# Patient Record
Sex: Female | Born: 1935 | Race: White | Hispanic: No | Marital: Married | Smoking: Never smoker
Health system: Southern US, Community
[De-identification: ages and names within clinical notes are randomized; demographics above are authoritative.]

## PROBLEM LIST (undated history)

## (undated) DIAGNOSIS — I10 Essential (primary) hypertension: Secondary | ICD-10-CM

## (undated) DIAGNOSIS — I509 Heart failure, unspecified: Secondary | ICD-10-CM

## (undated) DIAGNOSIS — D649 Anemia, unspecified: Secondary | ICD-10-CM

## (undated) DIAGNOSIS — M1990 Unspecified osteoarthritis, unspecified site: Secondary | ICD-10-CM

## (undated) DIAGNOSIS — N183 Chronic kidney disease, stage 3 unspecified: Secondary | ICD-10-CM

## (undated) DIAGNOSIS — L0390 Cellulitis, unspecified: Secondary | ICD-10-CM

## (undated) DIAGNOSIS — I071 Rheumatic tricuspid insufficiency: Secondary | ICD-10-CM

## (undated) HISTORY — DX: Rheumatic tricuspid insufficiency: I07.1

## (undated) HISTORY — PX: ELBOW SURGERY: SHX618

## (undated) HISTORY — PX: KNEE SURGERY: SHX244

---

## 1998-04-21 ENCOUNTER — Other Ambulatory Visit: Admission: RE | Admit: 1998-04-21 | Discharge: 1998-04-21 | Payer: Self-pay | Admitting: Gastroenterology

## 1998-05-19 ENCOUNTER — Ambulatory Visit: Admission: RE | Admit: 1998-05-19 | Discharge: 1998-05-19 | Payer: Self-pay | Admitting: Internal Medicine

## 1999-05-16 ENCOUNTER — Other Ambulatory Visit: Admission: RE | Admit: 1999-05-16 | Discharge: 1999-05-16 | Payer: Self-pay | Admitting: Gastroenterology

## 1999-05-16 ENCOUNTER — Encounter (INDEPENDENT_AMBULATORY_CARE_PROVIDER_SITE_OTHER): Payer: Self-pay | Admitting: *Deleted

## 2000-01-03 ENCOUNTER — Encounter: Payer: Self-pay | Admitting: Internal Medicine

## 2000-01-03 ENCOUNTER — Encounter: Admission: RE | Admit: 2000-01-03 | Discharge: 2000-01-03 | Payer: Self-pay | Admitting: Internal Medicine

## 2000-03-20 ENCOUNTER — Other Ambulatory Visit: Admission: RE | Admit: 2000-03-20 | Discharge: 2000-03-20 | Payer: Self-pay | Admitting: Internal Medicine

## 2000-03-24 ENCOUNTER — Inpatient Hospital Stay (HOSPITAL_COMMUNITY): Admission: EM | Admit: 2000-03-24 | Discharge: 2000-03-28 | Payer: Self-pay | Admitting: Emergency Medicine

## 2000-03-24 ENCOUNTER — Encounter: Payer: Self-pay | Admitting: Orthopedic Surgery

## 2000-03-24 ENCOUNTER — Encounter: Payer: Self-pay | Admitting: Emergency Medicine

## 2000-03-25 ENCOUNTER — Encounter: Payer: Self-pay | Admitting: Orthopedic Surgery

## 2000-03-26 ENCOUNTER — Encounter: Payer: Self-pay | Admitting: Orthopedic Surgery

## 2000-03-28 ENCOUNTER — Inpatient Hospital Stay (HOSPITAL_COMMUNITY)
Admission: RE | Admit: 2000-03-28 | Discharge: 2000-04-05 | Payer: Self-pay | Admitting: Physical Medicine & Rehabilitation

## 2000-04-03 ENCOUNTER — Encounter: Payer: Self-pay | Admitting: Orthopedic Surgery

## 2001-04-09 ENCOUNTER — Encounter: Admission: RE | Admit: 2001-04-09 | Discharge: 2001-04-09 | Payer: Self-pay | Admitting: Internal Medicine

## 2001-04-09 ENCOUNTER — Encounter: Payer: Self-pay | Admitting: Internal Medicine

## 2002-05-06 ENCOUNTER — Encounter: Admission: RE | Admit: 2002-05-06 | Discharge: 2002-05-06 | Payer: Self-pay | Admitting: Internal Medicine

## 2002-05-06 ENCOUNTER — Encounter: Payer: Self-pay | Admitting: Internal Medicine

## 2002-10-31 ENCOUNTER — Encounter: Payer: Self-pay | Admitting: Gastroenterology

## 2003-03-16 ENCOUNTER — Other Ambulatory Visit: Admission: RE | Admit: 2003-03-16 | Discharge: 2003-03-16 | Payer: Self-pay | Admitting: Internal Medicine

## 2003-06-15 ENCOUNTER — Encounter: Admission: RE | Admit: 2003-06-15 | Discharge: 2003-06-15 | Payer: Self-pay | Admitting: Internal Medicine

## 2004-07-29 ENCOUNTER — Encounter: Admission: RE | Admit: 2004-07-29 | Discharge: 2004-07-29 | Payer: Self-pay | Admitting: Internal Medicine

## 2004-08-26 ENCOUNTER — Ambulatory Visit: Payer: Self-pay | Admitting: Internal Medicine

## 2005-02-23 ENCOUNTER — Ambulatory Visit: Payer: Self-pay | Admitting: Internal Medicine

## 2005-06-13 ENCOUNTER — Ambulatory Visit: Payer: Self-pay | Admitting: Internal Medicine

## 2005-09-11 ENCOUNTER — Encounter: Admission: RE | Admit: 2005-09-11 | Discharge: 2005-09-11 | Payer: Self-pay | Admitting: Internal Medicine

## 2006-01-03 ENCOUNTER — Ambulatory Visit: Payer: Self-pay | Admitting: Internal Medicine

## 2006-05-15 ENCOUNTER — Ambulatory Visit: Payer: Self-pay | Admitting: Internal Medicine

## 2006-09-03 ENCOUNTER — Ambulatory Visit: Payer: Self-pay | Admitting: Internal Medicine

## 2006-11-02 ENCOUNTER — Encounter: Admission: RE | Admit: 2006-11-02 | Discharge: 2006-11-02 | Payer: Self-pay | Admitting: Internal Medicine

## 2006-12-25 ENCOUNTER — Ambulatory Visit: Payer: Self-pay | Admitting: Internal Medicine

## 2006-12-27 ENCOUNTER — Encounter: Payer: Self-pay | Admitting: Internal Medicine

## 2006-12-27 DIAGNOSIS — H409 Unspecified glaucoma: Secondary | ICD-10-CM | POA: Insufficient documentation

## 2006-12-27 DIAGNOSIS — I10 Essential (primary) hypertension: Secondary | ICD-10-CM | POA: Insufficient documentation

## 2006-12-27 DIAGNOSIS — K2270 Barrett's esophagus without dysplasia: Secondary | ICD-10-CM | POA: Insufficient documentation

## 2006-12-27 DIAGNOSIS — M1990 Unspecified osteoarthritis, unspecified site: Secondary | ICD-10-CM | POA: Insufficient documentation

## 2006-12-27 DIAGNOSIS — M810 Age-related osteoporosis without current pathological fracture: Secondary | ICD-10-CM | POA: Insufficient documentation

## 2007-06-18 ENCOUNTER — Ambulatory Visit: Payer: Self-pay | Admitting: Internal Medicine

## 2007-09-10 ENCOUNTER — Telehealth (INDEPENDENT_AMBULATORY_CARE_PROVIDER_SITE_OTHER): Payer: Self-pay | Admitting: *Deleted

## 2007-09-10 ENCOUNTER — Ambulatory Visit: Payer: Self-pay

## 2007-09-10 ENCOUNTER — Ambulatory Visit: Payer: Self-pay | Admitting: Internal Medicine

## 2007-09-10 DIAGNOSIS — R609 Edema, unspecified: Secondary | ICD-10-CM

## 2007-09-11 ENCOUNTER — Telehealth: Payer: Self-pay | Admitting: Internal Medicine

## 2007-12-11 ENCOUNTER — Encounter: Admission: RE | Admit: 2007-12-11 | Discharge: 2007-12-11 | Payer: Self-pay | Admitting: Internal Medicine

## 2008-06-02 ENCOUNTER — Ambulatory Visit: Payer: Self-pay | Admitting: Internal Medicine

## 2008-06-04 ENCOUNTER — Emergency Department (HOSPITAL_COMMUNITY): Admission: EM | Admit: 2008-06-04 | Discharge: 2008-06-04 | Payer: Self-pay | Admitting: Emergency Medicine

## 2008-06-05 ENCOUNTER — Ambulatory Visit: Payer: Self-pay | Admitting: Internal Medicine

## 2008-06-05 DIAGNOSIS — D638 Anemia in other chronic diseases classified elsewhere: Secondary | ICD-10-CM | POA: Insufficient documentation

## 2008-06-05 LAB — CONVERTED CEMR LAB: Ferritin: 18.3 ng/mL (ref 10.0–291.0)

## 2008-06-17 ENCOUNTER — Ambulatory Visit: Payer: Self-pay | Admitting: Family Medicine

## 2008-06-17 DIAGNOSIS — K625 Hemorrhage of anus and rectum: Secondary | ICD-10-CM

## 2008-06-17 DIAGNOSIS — R197 Diarrhea, unspecified: Secondary | ICD-10-CM

## 2008-06-22 ENCOUNTER — Ambulatory Visit: Payer: Self-pay | Admitting: Internal Medicine

## 2008-06-23 ENCOUNTER — Telehealth (INDEPENDENT_AMBULATORY_CARE_PROVIDER_SITE_OTHER): Payer: Self-pay | Admitting: *Deleted

## 2008-06-23 LAB — CONVERTED CEMR LAB
ALT: 17 units/L (ref 0–35)
Albumin: 3.4 g/dL — ABNORMAL LOW (ref 3.5–5.2)
Basophils Absolute: 0 10*3/uL (ref 0.0–0.1)
Eosinophils Absolute: 0 10*3/uL (ref 0.0–0.7)
Eosinophils Relative: 0.6 % (ref 0.0–5.0)
GFR calc non Af Amer: 33 mL/min
Hemoglobin: 9.1 g/dL — ABNORMAL LOW (ref 12.0–15.0)
MCHC: 33.4 g/dL (ref 30.0–36.0)
Monocytes Relative: 12.8 % — ABNORMAL HIGH (ref 3.0–12.0)
Potassium: 4.6 meq/L (ref 3.5–5.1)
RBC: 3.04 M/uL — ABNORMAL LOW (ref 3.87–5.11)
Total Bilirubin: 0.4 mg/dL (ref 0.3–1.2)
WBC: 6.6 10*3/uL (ref 4.5–10.5)

## 2008-07-15 DIAGNOSIS — K449 Diaphragmatic hernia without obstruction or gangrene: Secondary | ICD-10-CM | POA: Insufficient documentation

## 2008-07-15 DIAGNOSIS — K219 Gastro-esophageal reflux disease without esophagitis: Secondary | ICD-10-CM

## 2008-07-15 DIAGNOSIS — Z860100 Personal history of colon polyps, unspecified: Secondary | ICD-10-CM | POA: Insufficient documentation

## 2008-07-15 DIAGNOSIS — K222 Esophageal obstruction: Secondary | ICD-10-CM

## 2008-07-15 DIAGNOSIS — K5730 Diverticulosis of large intestine without perforation or abscess without bleeding: Secondary | ICD-10-CM | POA: Insufficient documentation

## 2008-07-16 ENCOUNTER — Ambulatory Visit: Payer: Self-pay | Admitting: Gastroenterology

## 2008-07-16 DIAGNOSIS — D509 Iron deficiency anemia, unspecified: Secondary | ICD-10-CM

## 2008-07-20 ENCOUNTER — Ambulatory Visit: Payer: Self-pay | Admitting: Gastroenterology

## 2008-08-05 ENCOUNTER — Ambulatory Visit: Payer: Self-pay | Admitting: Gastroenterology

## 2008-08-12 ENCOUNTER — Ambulatory Visit: Payer: Self-pay | Admitting: Gastroenterology

## 2008-09-10 ENCOUNTER — Telehealth: Payer: Self-pay | Admitting: Gastroenterology

## 2008-11-10 ENCOUNTER — Telehealth: Payer: Self-pay | Admitting: Internal Medicine

## 2009-09-16 ENCOUNTER — Ambulatory Visit: Payer: Self-pay | Admitting: Internal Medicine

## 2009-11-30 ENCOUNTER — Ambulatory Visit: Payer: Self-pay | Admitting: Internal Medicine

## 2009-11-30 ENCOUNTER — Telehealth: Payer: Self-pay | Admitting: Internal Medicine

## 2009-11-30 LAB — CONVERTED CEMR LAB
Basophils Relative: 0.2 % (ref 0.0–3.0)
CO2: 26 meq/L (ref 19–32)
Eosinophils Relative: 0 % (ref 0.0–5.0)
GFR calc non Af Amer: 28.37 mL/min (ref >60)
Lymphocytes Relative: 8.4 % — ABNORMAL LOW (ref 12.0–46.0)
Monocytes Absolute: 0.6 10*3/uL (ref 0.1–1.0)
Neutrophils Relative %: 82.6 % — ABNORMAL HIGH (ref 43.0–77.0)
Platelets: 240 10*3/uL (ref 150.0–400.0)
Potassium: 5.3 meq/L — ABNORMAL HIGH (ref 3.5–5.1)
RBC: 3.45 M/uL — ABNORMAL LOW (ref 3.87–5.11)
Sodium: 142 meq/L (ref 135–145)
WBC: 7.2 10*3/uL (ref 4.5–10.5)

## 2009-12-08 ENCOUNTER — Telehealth: Payer: Self-pay

## 2009-12-16 ENCOUNTER — Telehealth: Payer: Self-pay | Admitting: Internal Medicine

## 2009-12-20 ENCOUNTER — Telehealth: Payer: Self-pay | Admitting: Internal Medicine

## 2010-04-01 ENCOUNTER — Ambulatory Visit: Payer: Self-pay | Admitting: Internal Medicine

## 2010-04-01 LAB — CONVERTED CEMR LAB
BUN: 18 mg/dL (ref 6–23)
Calcium: 9.6 mg/dL (ref 8.4–10.5)
Creatinine, Ser: 1.3 mg/dL — ABNORMAL HIGH (ref 0.4–1.2)
GFR calc non Af Amer: 40.2 mL/min (ref >60)
Glucose, Bld: 96 mg/dL (ref 70–99)
HCT: 30.6 % — ABNORMAL LOW (ref 36.0–46.0)
Lymphocytes Relative: 15.6 % (ref 12.0–46.0)
Monocytes Absolute: 0.7 10*3/uL (ref 0.1–1.0)
Neutro Abs: 3.5 10*3/uL (ref 1.4–7.7)
Platelets: 222 10*3/uL (ref 150.0–400.0)
Potassium: 4.1 meq/L (ref 3.5–5.1)
Pro B Natriuretic peptide (BNP): 705.9 pg/mL — ABNORMAL HIGH (ref 0.0–100.0)
Sodium: 137 meq/L (ref 135–145)

## 2010-04-15 ENCOUNTER — Ambulatory Visit: Payer: Self-pay | Admitting: Internal Medicine

## 2010-04-15 DIAGNOSIS — I5020 Unspecified systolic (congestive) heart failure: Secondary | ICD-10-CM | POA: Insufficient documentation

## 2010-04-15 LAB — CONVERTED CEMR LAB
BUN: 31 mg/dL — ABNORMAL HIGH (ref 6–23)
Glucose, Bld: 98 mg/dL (ref 70–99)

## 2010-04-29 ENCOUNTER — Telehealth: Payer: Self-pay

## 2010-05-02 ENCOUNTER — Ambulatory Visit: Payer: Self-pay | Admitting: Internal Medicine

## 2010-08-09 ENCOUNTER — Ambulatory Visit
Admission: RE | Admit: 2010-08-09 | Discharge: 2010-08-09 | Payer: Self-pay | Source: Home / Self Care | Attending: Internal Medicine | Admitting: Internal Medicine

## 2010-08-09 ENCOUNTER — Other Ambulatory Visit: Payer: Self-pay | Admitting: Internal Medicine

## 2010-08-09 LAB — BASIC METABOLIC PANEL
BUN: 43 mg/dL — ABNORMAL HIGH (ref 6–23)
CO2: 24 mEq/L (ref 19–32)
Calcium: 9.2 mg/dL (ref 8.4–10.5)
Chloride: 107 mEq/L (ref 96–112)
Creatinine, Ser: 2.2 mg/dL — ABNORMAL HIGH (ref 0.4–1.2)
GFR: 22.71 mL/min — ABNORMAL LOW (ref >60.00)
Glucose, Bld: 125 mg/dL — ABNORMAL HIGH (ref 70–99)
Potassium: 4.5 mEq/L (ref 3.5–5.1)
Sodium: 139 mEq/L (ref 135–145)

## 2010-08-09 LAB — BRAIN NATRIURETIC PEPTIDE: Pro B Natriuretic peptide (BNP): 1414.6 pg/mL — ABNORMAL HIGH (ref 0.0–100.0)

## 2010-08-18 ENCOUNTER — Telehealth: Payer: Self-pay | Admitting: Internal Medicine

## 2010-09-04 LAB — CONVERTED CEMR LAB
ALT: 14 units/L (ref 0–35)
AST: 22 units/L (ref 0–37)
BUN: 20 mg/dL (ref 6–23)
Basophils Absolute: 0 10*3/uL (ref 0.0–0.1)
Basophils Relative: 0.2 % (ref 0.0–3.0)
CO2: 28 meq/L (ref 19–32)
HCT: 33.4 % — ABNORMAL LOW (ref 36.0–46.0)
Hemoglobin: 11 g/dL — ABNORMAL LOW (ref 12.0–15.0)
Lymphs Abs: 1.3 10*3/uL (ref 0.7–4.0)
MCV: 94.6 fL (ref 78.0–100.0)
Neutro Abs: 2.9 10*3/uL (ref 1.4–7.7)
Neutrophils Relative %: 60.8 % (ref 43.0–77.0)
Platelets: 237 10*3/uL (ref 150.0–400.0)
RBC: 3.53 M/uL — ABNORMAL LOW (ref 3.87–5.11)
RDW: 12.7 % (ref 11.5–14.6)
Sodium: 142 meq/L (ref 135–145)
TSH: 1.39 microintl units/mL (ref 0.35–5.50)
Total Bilirubin: 0.4 mg/dL (ref 0.3–1.2)
WBC: 4.8 10*3/uL (ref 4.5–10.5)

## 2010-09-08 NOTE — Progress Notes (Signed)
Summary: refill omeprazole  Phone Note Refill Request Message from:  Fax from Pharmacy on August 18, 2010 11:53 AM  Refills Requested: Medication #1:  PRILOSEC 20 MG CPDR Take 1 capsule by mouth every morning [facility]    Method Requested: Fax to Local Pharmacy Initial call taken by: Duard Brady LPN,  August 18, 2010 11:53 AM    Prescriptions: PRILOSEC 20 MG CPDR (OMEPRAZOLE) Take 1 capsule by mouth every morning  #90 x 6   Entered by:   Duard Brady LPN   Authorized by:   Gordy Savers  MD   Signed by:   Duard Brady LPN on 16/05/9603   Method used:   Faxed to ...       [facility] (mail-order)             , [place]         Ph: [phone]       Fax: [phone]   RxID:   [id number]

## 2010-09-08 NOTE — Assessment & Plan Note (Signed)
Summary: fu on med/njr/pt rescd//ccm   Vital Signs:  Patient profile:   77 year old female Weight:      128 pounds Temp:     98.2 degrees F oral BP sitting:   118 / 72  (left arm) Cuff size:   regular  Vitals Entered By: Duard Brady LPN (May 02, 2010 11:31 AM) CC: rov - f/u on meds - doing well Is Patient Diabetic? No   Primary Care Provider:  Marzella Schlein  CC:  rov - f/u on meds - doing well.  History of Present Illness: 76 year old patient who is seen today for follow-up of her congestive heart failure.  She is asymptomatic.  Her peripheral edema has greatly improved and her weight is down over the past few weeks.  Denies any shortness of breath, orthopnea, or PND.  Presently, she remains on furosemide 40 mg b.i.d.  Allergies: 1)  ! Morphine Sulfate Cr (Morphine Sulfate) 2)  ! Codeine Phosphate (Codeine Phosphate)  Past History:  Past Medical History: Reviewed history from 04/15/2010 and no changes required.  Hypertension Osteoporosis DJD Barrett's esophagus Glaucoma chronic kidney disease history of anemia Current Problems:  HIATAL HERNIA (ICD-553.3) ESOPHAGEAL STRICTURE (ICD-530.3) DIVERTICULOSIS, COLON (ICD-562.10) COLONIC POLYPS, ADENOMATOUS, HX OF (ICD-V12.72) GERD (ICD-530.81) RECTAL BLEEDING (ICD-569.3) DIARRHEA (ICD-787.91) ANEMIA OF CHRONIC DISEASE (ICD-285.29) LEG EDEMA, BILATERAL (ICD-782.3) GLAUCOMA NOS (ICD-365.9) DEGENERATIVE JOINT DISEASE (ICD-715.90) BARRETT'S ESOPHAGUS (ICD-530.85) OSTEOPOROSIS (ICD-733.00) HYPERTENSION (ICD-401.9) systolic heart failure  Past Surgical History: Reviewed history from 09/16/2009 and no changes required. Appendectomy Cataract extraction Total knee replacement Oophorectomy colonoscopy 2004, 2009 EGD 08-2008  Family History: both parents died of an apparent MI.  Father, age 36 mother age 61 4 brothers.  History prostate cancer .  He is one status post CABG  Family history also  positive for cerebral vascular disease and colon cancer No FH of Colon Cancer:  Review of Systems       The patient complains of peripheral edema.  The patient denies anorexia, fever, weight loss, weight gain, vision loss, decreased hearing, hoarseness, chest pain, syncope, dyspnea on exertion, prolonged cough, headaches, hemoptysis, abdominal pain, melena, hematochezia, severe indigestion/heartburn, hematuria, incontinence, genital sores, muscle weakness, suspicious skin lesions, transient blindness, difficulty walking, depression, unusual weight change, abnormal bleeding, enlarged lymph nodes, angioedema, and breast masses.    Physical Exam  General:  Well-developed,well-nourished,in no acute distress; alert,appropriate and cooperative throughout examination Head:  Normocephalic and atraumatic without obvious abnormalities. No apparent alopecia or balding. Eyes:  No corneal or conjunctival inflammation noted. EOMI. Perrla. Funduscopic exam benign, without hemorrhages, exudates or papilledema. Vision grossly normal. Mouth:  Oral mucosa and oropharynx without lesions or exudates.  Teeth in good repair. Neck:  No deformities, masses, or tenderness noted. Lungs:  Normal respiratory effort, chest expands symmetrically. Lungs are clear to auscultation, no crackles or wheezes.  O2 saturation 98 Heart:  Normal rate and regular rhythm. S1 and S2 normal without gallop, murmur, click, rub or other extra sounds.  pulse rate 55 to 60 Abdomen:  Bowel sounds positive,abdomen soft and non-tender without masses, organomegaly or hernias noted. Extremities:  1+ left pedal edema and 1+ right pedal edema.  1+ left pedal edema.     Impression & Recommendations:  Problem # 1:  CONGESTIVE HEART FAILURE, SYSTOLIC DYSFUNCTION (ICD-428.20)  The following medications were removed from the medication list:    Carvedilol 6.25 Mg Tabs (Carvedilol) ..... One half tablet twice daily Her updated medication list for  this problem includes:    Furosemide 40  Mg Tabs (Furosemide) ..... One every morning    Lisinopril 10 Mg Tabs (Lisinopril) ..... One daily    Carvedilol 3.125 Mg Tabs (Carvedilol) ..... One half tablet twice daily  The following medications were removed from the medication list:    Carvedilol 6.25 Mg Tabs (Carvedilol) ..... One half tablet twice daily Her updated medication list for this problem includes:    Furosemide 40 Mg Tabs (Furosemide) ..... One every morning    Lisinopril 10 Mg Tabs (Lisinopril) ..... One daily    Carvedilol 3.125 Mg Tabs (Carvedilol) ..... One half tablet twice daily  Problem # 2:  LEG EDEMA, BILATERAL (ICD-782.3)  Her updated medication list for this problem includes:    Furosemide 40 Mg Tabs (Furosemide) ..... One every morning  Her updated medication list for this problem includes:    Furosemide 40 Mg Tabs (Furosemide) ..... One every morning  Complete Medication List: 1)  Fosamax 70 Mg Tabs (Alendronate sodium) .... 1/2 q week 2)  Prilosec 20 Mg Cpdr (Omeprazole) .... Take 1 capsule by mouth every morning 3)  Calcium-vitamin D 600-200 Mg-unit Tabs (Calcium-vitamin d) .... 2 qd 4)  B Complex 100 Tabs (B complex vitamins) .... 2 qd 5)  Cvs Iron 325 (65 Fe) Mg Tabs (Ferrous sulfate) .Marland Kitchen.. 1 tablet by mouth every day 6)  Extra Strength Pain Reliever 500 Mg Tabs (Acetaminophen) .... Take three tablets by mouth once daily 7)  Furosemide 40 Mg Tabs (Furosemide) .... One every morning 8)  Lisinopril 10 Mg Tabs (Lisinopril) .... One daily 9)  Triple Flex 500-400-125 Mg Tabs (Glucosamine-chondroitin-msm) .... Qd 10)  Potassium Chloride Cr 10 Meq Cr-tabs (Potassium chloride) .... One  daily 11)  Carvedilol 3.125 Mg Tabs (Carvedilol) .... One half tablet twice daily  Patient Instructions: 1)  Limit your Sodium (Salt). 2)  WEIGH every Monday, Wednesday, and Friday 3)  call the office if there is any weight gain in excess of 5 pounds 4)  Please schedule a follow-up  appointment in 3 months. Prescriptions: CARVEDILOL 3.125 MG TABS (CARVEDILOL) one half tablet twice daily  #90 x 3   Entered and Authorized by:   Gordy Savers  MD   Signed by:   Gordy Savers  MD on 05/02/2010   Method used:   Electronically to        [facility] [phone]* (retail)       [address]       [place], [place]  [address]       Ph: [phone]       Fax: [phone]   RxID:   [id number]

## 2010-09-08 NOTE — Progress Notes (Signed)
Summary: pt rescd to 05-02-2010 due to transportation/weather issues  Phone Note Call from Patient   Caller: Patient---live call Initial call taken by: Warnell Forester,  April 29, 2010 9:14 AM

## 2010-09-08 NOTE — Assessment & Plan Note (Signed)
Summary: CPX (PT WILL COME IN FASTING) // RS   Vital Signs:  Patient profile:   77 year old [name] Height:      62 inches Weight:      126 pounds Temp:     98.3 degrees F BP sitting:   142 / 88  (right arm) Cuff size:   regular  Vitals Entered By: Duard Brady RN (September 16, 2009 9:55 AM)  Primary Care Provider:  Marzella Schlein   History of Present Illness: 77 year old patient who is seen today for a comprehensive evaluation.  Medical problems include a history of chronic kidney disease, and anemia of chronic disease.  She has osteoarthritis.  She has treated hypertension and a history of gastroesophageal reflux disease.  She has osteoporosis.  She has done quite well over the past several months.  She denies any cardiopulmonary complaints.  Her only complaint really are musculoskeletal.  over the past year or two, she  has had  upper and lower endoscopy.  She remains on chronic iron therapy  Allergies: 1)  ! Morphine Sulfate Cr (Morphine Sulfate) 2)  ! Codeine Phosphate (Codeine Phosphate)  Past History:  Past Medical History: Reviewed history from 07/16/2008 and no changes required.  Hypertension Osteoporosis DJD Barrett's esophagus Glaucoma chronic kidney disease history of anemia Current Problems:  HIATAL HERNIA (ICD-553.3) ESOPHAGEAL STRICTURE (ICD-530.3) DIVERTICULOSIS, COLON (ICD-562.10) COLONIC POLYPS, ADENOMATOUS, HX OF (ICD-V12.72) GERD (ICD-530.Valerie) RECTAL BLEEDING (ICD-569.3) DIARRHEA (ICD-787.91) ANEMIA OF CHRONIC DISEASE (ICD-285.29) LEG EDEMA, BILATERAL (ICD-782.3) GLAUCOMA NOS (ICD-365.9) DEGENERATIVE JOINT DISEASE (ICD-715.90) BARRETT'S ESOPHAGUS (ICD-530.85) OSTEOPOROSIS (ICD-733.00) HYPERTENSION (ICD-401.9)  Past Surgical History: Appendectomy Cataract extraction Total knee replacement Oophorectomy colonoscopy 2004, 2009 EGD 08-2008  Family History: Reviewed history from 07/16/2008 and no changes required. both parents died  of an apparent MI.  Father, age 12 mother age 1 4 brothers.  History prostate cancer recurs.  He is one status post CABG  Family history also positive for cerebral vascular disease and colon cancer No FH of Colon Cancer:  Social History: Reviewed history from 07/16/2008 and no changes required. Occupation: retired Patient has never smoked.  Alcohol Use - no Illicit Drug Use - no  Review of Systems       The patient complains of difficulty walking.  The patient denies anorexia, fever, weight loss, weight gain, vision loss, decreased hearing, hoarseness, chest pain, syncope, dyspnea on exertion, peripheral edema, prolonged cough, headaches, hemoptysis, abdominal pain, melena, hematochezia, severe indigestion/heartburn, hematuria, incontinence, genital sores, muscle weakness, suspicious skin lesions, transient blindness, depression, unusual weight change, abnormal bleeding, enlarged lymph nodes, angioedema, and breast masses.    Physical Exam  General:  elderly alert, no distress.  Blood pressure 120/80. Head:  Normocephalic and atraumatic without obvious abnormalities. No apparent alopecia or balding. Eyes:  No corneal or conjunctival inflammation noted. EOMI. Perrla. Funduscopic exam benign, without hemorrhages, exudates or papilledema. Vision grossly normal. right thyroidectomy scar Ears:  External ear exam shows no significant lesions or deformities.  Otoscopic examination reveals clear canals, tympanic membranes are intact bilaterally without bulging, retraction, inflammation or discharge. Hearing is grossly normal bilaterally. Nose:  External nasal examination shows no deformity or inflammation. Nasal mucosa are pink and moist without lesions or exudates. Mouth:  Oral mucosa and oropharynx without lesions or exudates.  upper dentures in place Neck:  No deformities, masses, or tenderness noted. Chest Wall:  No deformities, masses, or tenderness noted. Breasts:  No mass, nodules,  thickening, tenderness, bulging, retraction, inflamation, nipple discharge or skin changes noted.  Lungs:  Normal respiratory effort, chest expands symmetrically. Lungs are clear to auscultation, no crackles or wheezes. Heart:  Normal rate and regular rhythm. S1 and S2 normal without gallop, murmur, click, rub or other extra sounds. Abdomen:  Bowel sounds positive,abdomen soft and non-tender without masses, organomegaly or hernias noted. lower midline scar Rectal:  No external abnormalities noted. Normal sphincter tone. No rectal masses or tenderness. stool  hematest negative Msk:  osteo-arthritic changes and onychomycotic toenails surgical scar, left knee Pulses:  not easily palpable Extremities:  No clubbing, cyanosis, edema, or deformity noted with normal full range of motion of all joints.   Neurologic:  alert & oriented X3, cranial nerves II-XII intact, strength normal in all extremities, sensation intact to light touch, sensation intact to pinprick, and gait normal.  decreased left  patellar reflexalert & oriented X3, cranial nerves II-XII intact, strength normal in all extremities, sensation intact to light touch, sensation intact to pinprick, and gait normal.   Skin:  Intact without suspicious lesions or rashes Cervical Nodes:  No lymphadenopathy noted Axillary Nodes:  No palpable lymphadenopathy Inguinal Nodes:  No significant adenopathy Psych:  Cognition and judgment appear intact. Alert and cooperative with normal attention span and concentration. No apparent delusions, illusions, hallucinations   Impression & Recommendations:  Problem # 1:  ANEMIA-IRON DEFICIENCY (ICD-280.9)  Her updated medication list for this problem includes:    Cvs Iron 325 (65 Fe) Mg Tabs (Ferrous sulfate) .Marland Kitchen... 1 tablet by mouth every day  Her updated medication list for this problem includes:    Cvs Iron 325 (65 Fe) Mg Tabs (Ferrous sulfate) .Marland Kitchen... 1 tablet by mouth every day  Orders: Venipuncture  (81191) TLB-BMP (Basic Metabolic Panel-BMET) (80048-METABOL) TLB-CBC Platelet - w/Differential (85025-CBCD) TLB-Hepatic/Liver Function Pnl (80076-HEPATIC) TLB-TSH (Thyroid Stimulating Hormone) (84443-TSH)  Problem # 2:  GERD (ICD-530.Valerie)  Her updated medication list for this problem includes:    Prilosec 20 Mg Cpdr (Omeprazole) .Marland Kitchen... Take 1 capsule by mouth every morning  Her updated medication list for this problem includes:    Prilosec 20 Mg Cpdr (Omeprazole) .Marland Kitchen... Take 1 capsule by mouth every morning  Problem # 3:  DEGENERATIVE JOINT DISEASE (ICD-715.90)  Her updated medication list for this problem includes:    Extra Strength Pain Reliever 500 Mg Tabs (Acetaminophen) .Marland Kitchen... Take three tablets by mouth once daily  Her updated medication list for this problem includes:    Extra Strength Pain Reliever 500 Mg Tabs (Acetaminophen) .Marland Kitchen... Take three tablets by mouth once daily  Problem # 4:  HYPERTENSION (ICD-401.9)  Her updated medication list for this problem includes:    Doxazosin Mesylate 8 Mg Tabs (Doxazosin mesylate) .Marland Kitchen... 1/2 once daily    Amlodipine Besylate 5 Mg Tabs (Amlodipine besylate) ..... One daily  Orders: Venipuncture (47829) TLB-BMP (Basic Metabolic Panel-BMET) (80048-METABOL) TLB-CBC Platelet - w/Differential (85025-CBCD) TLB-Hepatic/Liver Function Pnl (80076-HEPATIC) TLB-TSH (Thyroid Stimulating Hormone) (84443-TSH)  Problem # 5:  OSTEOPOROSIS (ICD-733.00)  Her updated medication list for this problem includes:    Fosamax 70 Mg Tabs (Alendronate sodium) .Marland Kitchen... 1/2 q week  Her updated medication list for this problem includes:    Fosamax 70 Mg Tabs (Alendronate sodium) .Marland Kitchen... 1/2 q week  Complete Medication List: 1)  Doxazosin Mesylate 8 Mg Tabs (Doxazosin mesylate) .... 1/2 once daily 2)  Fosamax 70 Mg Tabs (Alendronate sodium) .... 1/2 q week 3)  Prilosec 20 Mg Cpdr (Omeprazole) .... Take 1 capsule by mouth every morning 4)  Calcium-vitamin D 600-200  Mg-unit Tabs (Calcium-vitamin d) .Marland KitchenMarland KitchenMarland Kitchen  2 qd 5)  B Complex 100 Tabs (B complex vitamins) .... 2 qd 6)  Amlodipine Besylate 5 Mg Tabs (Amlodipine besylate) .... One daily 7)  Glucosamine-msm 500-400 Mg Caps (Glucosamine sulfate-msm) .... Take 2 capsules by mouth once daily 8)  Cvs Iron 325 (65 Fe) Mg Tabs (Ferrous sulfate) .Marland Kitchen.. 1 tablet by mouth every day 9)  Extra Strength Pain Reliever 500 Mg Tabs (Acetaminophen) .... Take three tablets by mouth once daily  Other Orders: DRE (Z6109)  Patient Instructions: 1)  Please schedule a follow-up appointment in 6 months. 2)  Limit your Sodium (Salt). 3)  It is important that you exercise regularly at least 20 minutes 5 times a week. If you develop chest pain, have severe difficulty breathing, or feel very tired , stop exercising immediately and seek medical attention. Prescriptions: AMLODIPINE BESYLATE 5 MG TABS (AMLODIPINE BESYLATE) one daily  #90 Each x 6   Entered and Authorized by:   Gordy Savers  MD   Signed by:   Gordy Savers  MD on 09/16/2009   Method used:   Print then Give to Patient   RxID:   [id number] PRILOSEC 20 MG CPDR (OMEPRAZOLE) Take 1 capsule by mouth every morning  #90 x 6   Entered and Authorized by:   Gordy Savers  MD   Signed by:   Gordy Savers  MD on 09/16/2009   Method used:   Print then Give to Patient   RxID:   [id number] FOSAMAX 70 MG TABS (ALENDRONATE SODIUM) 1/2 q week  #12 Each x 6   Entered and Authorized by:   Gordy Savers  MD   Signed by:   Gordy Savers  MD on 09/16/2009   Method used:   Print then Give to Patient   RxID:   [id number] DOXAZOSIN MESYLATE 8 MG TABS (DOXAZOSIN MESYLATE) 1/2 once daily  #100 Each x 6   Entered and Authorized by:   Gordy Savers  MD   Signed by:   Gordy Savers  MD on 09/16/2009   Method used:   Print then Give to Patient   RxID:   [id number]   Appended Document: CPX (PT WILL COME IN FASTING) //  RS     Allergies: 1)  ! Morphine Sulfate Cr (Morphine Sulfate) 2)  ! Codeine Phosphate (Codeine Phosphate)   Complete Medication List: 1)  Doxazosin Mesylate 8 Mg Tabs (Doxazosin mesylate) .... 1/2 once daily 2)  Fosamax 70 Mg Tabs (Alendronate sodium) .... 1/2 q week 3)  Prilosec 20 Mg Cpdr (Omeprazole) .... Take 1 capsule by mouth every morning 4)  Calcium-vitamin D 600-200 Mg-unit Tabs (Calcium-vitamin d) .... 2 qd 5)  B Complex 100 Tabs (B complex vitamins) .... 2 qd 6)  Amlodipine Besylate 5 Mg Tabs (Amlodipine besylate) .... One daily 7)  Glucosamine-msm 500-400 Mg Caps (Glucosamine sulfate-msm) .... Take 2 capsules by mouth once daily 8)  Cvs Iron 325 (65 Fe) Mg Tabs (Ferrous sulfate) .Marland Kitchen.. 1 tablet by mouth every day 9)  Extra Strength Pain Reliever 500 Mg Tabs (Acetaminophen) .... Take three tablets by mouth once daily  Other Orders: UA Dipstick w/o Micro (manual) (63875) EKG w/ Interpretation (93000)   Laboratory Results   Urine Tests  Date/Time Received: September 16, 2009   Routine Urinalysis   Color: yellow Appearance: Clear Glucose: negative   (Normal Range: Negative) Bilirubin: negative   (Normal Range: Negative) Ketone: negative   (Normal Range: Negative) Spec. Gravity: 1.010   (  Normal Range: 1.003-1.035) Blood: negative   (Normal Range: Negative) pH: 6.0   (Normal Range: 5.0-8.0) Protein: negative   (Normal Range: Negative) Urobilinogen: 0.2   (Normal Range: 0-1) Nitrite: negative   (Normal Range: Negative) Leukocyte Esterace: negative   (Normal Range: Negative)    Comments: Duard Brady RN  September 16, 2009 11:24 AM

## 2010-09-08 NOTE — Progress Notes (Signed)
Summary: edema  Phone Note Call from Patient   Caller: Patient Call For: Gordy Savers  MD Summary of Call: Pt is complaining of edema legs and ankley.  She is holding Doxazosin as Dr. Kirtland Bouchard ordered.  Could this be the reason the swelling is so much worse? [phone] Initial call taken by: Lynann Beaver CMA,  Dec 16, 2009 4:30 PM  Follow-up for Phone Call        probably related to amlodipine-  decrease salt and make ROV next week if swelling worsens and will change BP med Follow-up by: Gordy Savers  MD,  Dec 16, 2009 4:58 PM  Additional Follow-up for Phone Call Additional follow up Details #1::        Line busy. Additional Follow-up by: Lynann Beaver CMA,  Dec 16, 2009 5:01 PM    Additional Follow-up for Phone Call Additional follow up Details #2::    Pt.  notified. Follow-up by: Lynann Beaver CMA,  Dec 17, 2009 8:09 AM

## 2010-09-08 NOTE — Assessment & Plan Note (Signed)
Summary: cough/dm   Vital Signs:  Patient profile:   77 year old female Weight:      143 pounds Temp:     97.8 degrees F oral BP sitting:   150 / 90  Vitals Entered By: Kathrynn Speed CMA (April 01, 2010 10:20 AM) CC: cough, x 3 weeks, swelling in both in groin area, sting in the vagina area, x 4 weeks,  finger are getting numb, when up near her face,src Is Patient Diabetic? No   Primary Care Provider:  Marzella Schlein  CC:  cough, x 3 weeks, swelling in both in groin area, sting in the vagina area, x 4 weeks, finger are getting numb, when up near her face, and src.  History of Present Illness: an 77 year old patient, who presents with a chief complaint of nonproductive cough for about 4 weeks.  Her chief complaint is worsening  lower extremity swelling.  This has progressed to the point that it involves the edema of the genital region.  She has treated hypertension on amlodipine.  She does have a history of prior lower extremity edema.  Denies any other symptoms of heart failure.  Does have a history of mild anemia and chronic kidney disease.  She has gastroesophageal reflux disease  Current Medications (verified): 1)  Doxazosin Mesylate 8 Mg Tabs (Doxazosin Mesylate) .... 1/2 Once Daily 2)  Fosamax 70 Mg Tabs (Alendronate Sodium) .... 1/2 Q Week 3)  Prilosec 20 Mg Cpdr (Omeprazole) .... Take 1 Capsule By Mouth Every Morning 4)  Calcium-Vitamin D 600-200 Mg-Unit  Tabs (Calcium-Vitamin D) .... 2 Qd 5)  B Complex 100   Tabs (B Complex Vitamins) .... 2 Qd 6)  Amlodipine Besylate 5 Mg Tabs (Amlodipine Besylate) .... One Daily 7)  Glucosamine-Msm 500-400 Mg Caps (Glucosamine Sulfate-Msm) .... Take 2 Capsules By Mouth Once Daily 8)  Cvs Iron 325 (65 Fe) Mg Tabs (Ferrous Sulfate) .Marland Kitchen.. 1 Tablet By Mouth Every Day 9)  Extra Strength Pain Reliever 500 Mg Tabs (Acetaminophen) .... Take Three Tablets By Mouth Once Daily  Allergies (verified): 1)  ! Morphine Sulfate Cr (Morphine  Sulfate) 2)  ! Codeine Phosphate (Codeine Phosphate)  Past History:  Past Medical History: Reviewed history from 07/16/2008 and no changes required.  Hypertension Osteoporosis DJD Barrett's esophagus Glaucoma chronic kidney disease history of anemia Current Problems:  HIATAL HERNIA (ICD-553.3) ESOPHAGEAL STRICTURE (ICD-530.3) DIVERTICULOSIS, COLON (ICD-562.10) COLONIC POLYPS, ADENOMATOUS, HX OF (ICD-V12.72) GERD (ICD-530.81) RECTAL BLEEDING (ICD-569.3) DIARRHEA (ICD-787.91) ANEMIA OF CHRONIC DISEASE (ICD-285.29) LEG EDEMA, BILATERAL (ICD-782.3) GLAUCOMA NOS (ICD-365.9) DEGENERATIVE JOINT DISEASE (ICD-715.90) BARRETT'S ESOPHAGUS (ICD-530.85) OSTEOPOROSIS (ICD-733.00) HYPERTENSION (ICD-401.9)  Past Surgical History: Reviewed history from 09/16/2009 and no changes required. Appendectomy Cataract extraction Total knee replacement Oophorectomy colonoscopy 2004, 2009 EGD 08-2008  Review of Systems       The patient complains of peripheral edema and prolonged cough.  The patient denies anorexia, fever, weight loss, weight gain, vision loss, decreased hearing, hoarseness, chest pain, syncope, dyspnea on exertion, headaches, hemoptysis, abdominal pain, melena, hematochezia, severe indigestion/heartburn, hematuria, incontinence, genital sores, muscle weakness, suspicious skin lesions, transient blindness, difficulty walking, depression, unusual weight change, abnormal bleeding, enlarged lymph nodes, angioedema, and breast masses.    Physical Exam  General:  elderly frail, no distress.  Blood pressure, normal Head:  Normocephalic and atraumatic without obvious abnormalities. No apparent alopecia or balding. Eyes:  No corneal or conjunctival inflammation noted. EOMI. Perrla. Funduscopic exam benign, without hemorrhages, exudates or papilledema. Vision grossly normal. Mouth:  Oral mucosa and oropharynx without  lesions or exudates.  dentures in place Neck:  prominent neck vein  distention Lungs:  Normal respiratory effort, chest expands symmetrically. Lungs are clear to auscultation, no crackles or wheezes. Heart:  Normal rate and regular rhythm. S1 and S2 normal without gallop, murmur, click, rub or other extra sounds. Abdomen:  Bowel sounds positive,abdomen soft and non-tender without masses, organomegaly or hernias noted. Msk:  No deformity or scoliosis noted of thoracic or lumbar spine.   Extremities:  striking peripheral edema, extending to the upper thigh and buttock region Skin:  Intact without suspicious lesions or rashes Cervical Nodes:  No lymphadenopathy noted   Impression & Recommendations:  Problem # 1:  ANEMIA-IRON DEFICIENCY (ICD-280.9)  Her updated medication list for this problem includes:    Cvs Iron 325 (65 Fe) Mg Tabs (Ferrous sulfate) .Marland Kitchen... 1 tablet by mouth every day  Her updated medication list for this problem includes:    Cvs Iron 325 (65 Fe) Mg Tabs (Ferrous sulfate) .Marland Kitchen... 1 tablet by mouth every day  Problem # 2:  LEG EDEMA, BILATERAL (ICD-782.3)  Her updated medication list for this problem includes:    Furosemide 40 Mg Tabs (Furosemide) ..... One twice daily for 3 days, then one every morning    Her updated medication list for this problem includes:    Furosemide 40 Mg Tabs (Furosemide) ..... One twice daily for 3 days, then one every morning  Orders: TLB-BNP (B-Natriuretic Peptide) (83880-BNPR) Specimen Handling (04540)  Problem # 3:  HYPERTENSION (ICD-401.9)  The following medications were removed from the medication list:    Amlodipine Besylate 5 Mg Tabs (Amlodipine besylate) ..... One daily Her updated medication list for this problem includes:    Doxazosin Mesylate 8 Mg Tabs (Doxazosin mesylate) .Marland Kitchen... 1/2 once daily    Furosemide 40 Mg Tabs (Furosemide) ..... One twice daily for 3 days, then one every morning    Lisinopril 10 Mg Tabs (Lisinopril) ..... One daily    The following medications were removed from the  medication list:    Amlodipine Besylate 5 Mg Tabs (Amlodipine besylate) ..... One daily Her updated medication list for this problem includes:    Doxazosin Mesylate 8 Mg Tabs (Doxazosin mesylate) .Marland Kitchen... 1/2 once daily    Furosemide 40 Mg Tabs (Furosemide) ..... One twice daily for 3 days, then one every morning    Lisinopril 10 Mg Tabs (Lisinopril) ..... One daily  Orders: Venipuncture (98119) TLB-BMP (Basic Metabolic Panel-BMET) (80048-METABOL) Specimen Handling (14782)  Complete Medication List: 1)  Doxazosin Mesylate 8 Mg Tabs (Doxazosin mesylate) .... 1/2 once daily 2)  Fosamax 70 Mg Tabs (Alendronate sodium) .... 1/2 q week 3)  Prilosec 20 Mg Cpdr (Omeprazole) .... Take 1 capsule by mouth every morning 4)  Calcium-vitamin D 600-200 Mg-unit Tabs (Calcium-vitamin d) .... 2 qd 5)  B Complex 100 Tabs (B complex vitamins) .... 2 qd 6)  Glucosamine-msm 500-400 Mg Caps (Glucosamine sulfate-msm) .... Take 2 capsules by mouth once daily 7)  Cvs Iron 325 (65 Fe) Mg Tabs (Ferrous sulfate) .Marland Kitchen.. 1 tablet by mouth every day 8)  Extra Strength Pain Reliever 500 Mg Tabs (Acetaminophen) .... Take three tablets by mouth once daily 9)  Furosemide 40 Mg Tabs (Furosemide) .... One twice daily for 3 days, then one every morning 10)  Lisinopril 10 Mg Tabs (Lisinopril) .... One daily  Other Orders: Capillary Blood Glucose/CBG (95621) TLB-CBC Platelet - w/Differential (85025-CBCD)  Patient Instructions: 1)  Please schedule a follow-up appointment in 2 weeks. 2)  Limit your  Sodium (Salt). 3)  keep legs elevated as much as possible 4)  discontinue amlodipine 5)  call immediately if  you  develope  any shortness of breath Prescriptions: LISINOPRIL 10 MG TABS (LISINOPRIL) one daily  #90 x 3   Entered and Authorized by:   Gordy Savers  MD   Signed by:   Gordy Savers  MD on 04/01/2010   Method used:   Electronically to        [facility] [phone]* (retail)       [address]       [place], [place]  [address]       Ph: [phone]       Fax: [phone]   RxID:   [id number] FUROSEMIDE 40 MG TABS (FUROSEMIDE) one twice daily for 3 days, then one every morning  #90 x 3   Entered and Authorized by:   Gordy Savers  MD   Signed by:   Gordy Savers  MD on 04/01/2010   Method used:   Electronically to        [facility] [phone]* (retail)       [address]       [place], [place]  [address]       Ph: [phone]       Fax: [phone]   RxID:   [id number]

## 2010-09-08 NOTE — Assessment & Plan Note (Signed)
Summary: 3 MONTH F/U//ALP/pt rescd from bump//ccm   Vital Signs:  Patient profile:   77 year old female Weight:      130 pounds Temp:     98.0 degrees F 120 Resp:     78 per minute BP sitting:   120 / 78  (left arm) Cuff size:   regular  Vitals Entered By: Duard Brady LPN (August 09, 2010 3:23 PM) CC: 3 mos rov - ok Is Patient Diabetic? No   Primary Care Provider:  Marzella Schlein  CC:  3 mos rov - ok.  History of Present Illness: 74 -year-old patient who is seen today for follow-up.  She has a history of congestive heart failure and has been fairly stable.  She has some dyspnea on exertion, and chronic lower extremity edema that has been fairly stable.  She has treated hypertension, history of anemia, chronic disease.  She has osteoporosis and has been on Fosamax for an excess of 5 years.  Denies any PND or orthopnea  Allergies: 1)  ! Morphine Sulfate Cr (Morphine Sulfate) 2)  ! Codeine Phosphate (Codeine Phosphate)  Past History:  Past Medical History: Reviewed history from 04/15/2010 and no changes required.  Hypertension Osteoporosis DJD Barrett's esophagus Glaucoma chronic kidney disease history of anemia Current Problems:  HIATAL HERNIA (ICD-553.3) ESOPHAGEAL STRICTURE (ICD-530.3) DIVERTICULOSIS, COLON (ICD-562.10) COLONIC POLYPS, ADENOMATOUS, HX OF (ICD-V12.72) GERD (ICD-530.81) RECTAL BLEEDING (ICD-569.3) DIARRHEA (ICD-787.91) ANEMIA OF CHRONIC DISEASE (ICD-285.29) LEG EDEMA, BILATERAL (ICD-782.3) GLAUCOMA NOS (ICD-365.9) DEGENERATIVE JOINT DISEASE (ICD-715.90) BARRETT'S ESOPHAGUS (ICD-530.85) OSTEOPOROSIS (ICD-733.00) HYPERTENSION (ICD-401.9) systolic heart failure  Past Surgical History: Reviewed history from 09/16/2009 and no changes required. Appendectomy Cataract extraction Total knee replacement Oophorectomy colonoscopy 2004, 2009 EGD 08-2008  Review of Systems       The patient complains of dyspnea on exertion, peripheral  edema, and difficulty walking.  The patient denies anorexia, fever, weight loss, weight gain, vision loss, decreased hearing, hoarseness, chest pain, syncope, prolonged cough, headaches, hemoptysis, abdominal pain, melena, hematochezia, severe indigestion/heartburn, hematuria, incontinence, genital sores, muscle weakness, suspicious skin lesions, transient blindness, depression, unusual weight change, abnormal bleeding, enlarged lymph nodes, angioedema, and breast masses.    Physical Exam  General:  elderly,  alert, no distress; blood pressure 104/70 Mouth:  Oral mucosa and oropharynx without lesions or exudates.  Teeth in good repair. Neck:  No deformities, masses, or tenderness noted. prominent jugular venous pressure Lungs:  Normal respiratory effort, chest expands symmetrically. Lungs are clear to auscultation, no crackles or wheezes. Heart:  Normal rate and regular rhythm. S1 and S2 normal without gallop, murmur, click, rub or other extra sounds. Abdomen:  Bowel sounds positive,abdomen soft and non-tender without masses, organomegaly or hernias noted. Extremities:  2+ left pedal edema and 2+ right pedal edema.  compression stockings in place2+ left pedal edema and 2+ right pedal edema.     Impression & Recommendations:  Problem # 1:  CONGESTIVE HEART FAILURE, SYSTOLIC DYSFUNCTION (ICD-428.20)  Her updated medication list for this problem includes:    Furosemide 40 Mg Tabs (Furosemide) ..... One every morning    Lisinopril 10 Mg Tabs (Lisinopril) ..... One daily    Carvedilol 3.125 Mg Tabs (Carvedilol) ..... One half tablet twice daily  Orders: Venipuncture (45409) TLB-BMP (Basic Metabolic Panel-BMET) (80048-METABOL) TLB-BNP (B-Natriuretic Peptide) (83880-BNPR) Specimen Handling (81191)  Her updated medication list for this problem includes:    Furosemide 40 Mg Tabs (Furosemide) ..... One every morning    Lisinopril 10 Mg Tabs (Lisinopril) ..... One daily  Carvedilol 3.125 Mg  Tabs (Carvedilol) ..... One half tablet twice daily  Problem # 2:  ANEMIA-IRON DEFICIENCY (ICD-280.9)  Her updated medication list for this problem includes:    Cvs Iron 325 (65 Fe) Mg Tabs (Ferrous sulfate) .Marland Kitchen... 1 tablet by mouth every day  Orders: Venipuncture (81191) TLB-BMP (Basic Metabolic Panel-BMET) (80048-METABOL) TLB-BNP (B-Natriuretic Peptide) (83880-BNPR)  Her updated medication list for this problem includes:    Cvs Iron 325 (65 Fe) Mg Tabs (Ferrous sulfate) .Marland Kitchen... 1 tablet by mouth every day  Complete Medication List: 1)  Prilosec 20 Mg Cpdr (Omeprazole) .... Take 1 capsule by mouth every morning 2)  Calcium-vitamin D 600-200 Mg-unit Tabs (Calcium-vitamin d) .... 2 qd 3)  B Complex 100 Tabs (B complex vitamins) .... 2 qd 4)  Cvs Iron 325 (65 Fe) Mg Tabs (Ferrous sulfate) .Marland Kitchen.. 1 tablet by mouth every day 5)  Extra Strength Pain Reliever 500 Mg Tabs (Acetaminophen) .... Take three tablets by mouth once daily 6)  Furosemide 40 Mg Tabs (Furosemide) .... One every morning 7)  Lisinopril 10 Mg Tabs (Lisinopril) .... One daily 8)  Triple Flex 500-400-125 Mg Tabs (Glucosamine-chondroitin-msm) .... Qd 9)  Potassium Chloride Cr 10 Meq Cr-tabs (Potassium chloride) .... One  daily 10)  Carvedilol 3.125 Mg Tabs (Carvedilol) .... One half tablet twice daily  Patient Instructions: 1)  Please schedule a follow-up appointment in 3 months. 2)  Limit your Sodium (Salt).   Orders Added: 1)  Est. Patient Level III [47829] 2)  Venipuncture [36415] 3)  TLB-BMP (Basic Metabolic Panel-BMET) [80048-METABOL] 4)  TLB-BNP (B-Natriuretic Peptide) [83880-BNPR] 5)  Specimen Handling [99000]

## 2010-09-08 NOTE — Assessment & Plan Note (Signed)
Summary: nausea, diarrhea/dm   Vital Signs:  Patient profile:   77 year old female Weight:      126 pounds Temp:     98.0 degrees F oral BP sitting:   150 / 90  (right arm) Cuff size:   regular  Vitals Entered By: Duard Brady LPN (November 30, 2009 1:54 PM) CC: c/o weakness and (L) shoulder/arm soreness, chest soreness Is Patient Diabetic? No   Primary Care Provider:  Marzella Schlein  CC:  c/o weakness and (L) shoulder/arm soreness and chest soreness.  History of Present Illness:  77 year old patient with a two-day history of weakness onset of diarrhea and nausea this morning.  She describes a poor energy, arm numbness, nonspecific soreness in the chest, shoulders, and arms.  Her daughter gave her Imodium earlier today, and the diarrhea has largely resolved.  At the present time.  Her only complaint is weakness.  Denies any fever or chills.  Last night, apparently she had some sweats.  She has a history of treated hypertension.  She states her p.o. intake has been marginal.  For the past two days  Allergies: 1)  ! Morphine Sulfate Cr (Morphine Sulfate) 2)  ! Codeine Phosphate (Codeine Phosphate)  Past History:  Past Medical History: Reviewed history from 07/16/2008 and no changes required.  Hypertension Osteoporosis DJD Barrett's esophagus Glaucoma chronic kidney disease history of anemia Current Problems:  HIATAL HERNIA (ICD-553.3) ESOPHAGEAL STRICTURE (ICD-530.3) DIVERTICULOSIS, COLON (ICD-562.10) COLONIC POLYPS, ADENOMATOUS, HX OF (ICD-V12.72) GERD (ICD-530.81) RECTAL BLEEDING (ICD-569.3) DIARRHEA (ICD-787.91) ANEMIA OF CHRONIC DISEASE (ICD-285.29) LEG EDEMA, BILATERAL (ICD-782.3) GLAUCOMA NOS (ICD-365.9) DEGENERATIVE JOINT DISEASE (ICD-715.90) BARRETT'S ESOPHAGUS (ICD-530.85) OSTEOPOROSIS (ICD-733.00) HYPERTENSION (ICD-401.9)  Review of Systems       The patient complains of anorexia.  The patient denies fever, weight loss, weight gain, vision  loss, decreased hearing, hoarseness, chest pain, syncope, dyspnea on exertion, peripheral edema, prolonged cough, headaches, hemoptysis, abdominal pain, melena, hematochezia, severe indigestion/heartburn, hematuria, incontinence, genital sores, muscle weakness, suspicious skin lesions, difficulty walking, depression, unusual weight change, abnormal bleeding, enlarged lymph nodes, angioedema, and breast masses.    Physical Exam  General:  elderly alert, oriented, no distress.  Blood pressure 130/80, afebrile Head:  Normocephalic and atraumatic without obvious abnormalities. No apparent alopecia or balding. Eyes:  No corneal or conjunctival inflammation noted. EOMI. Perrla. Funduscopic exam benign, without hemorrhages, exudates or papilledema. Vision grossly normal. Mouth:  Oral mucosa and oropharynx without lesions or exudates.  appears well-hydrated Neck:  No deformities, masses, or tenderness noted. Chest Wall:  No deformities, masses, or tenderness noted. Lungs:  Normal respiratory effort, chest expands symmetrically. Lungs are clear to auscultation, no crackles or wheezes. Heart:  Normal rate and regular rhythm. S1 and S2 normal without gallop, murmur, click, rub or other extra sounds. Abdomen:  Bowel sounds positive,abdomen soft and non-tender without masses, organomegaly or hernias noted. normal bowel sounds   Impression & Recommendations:  Problem # 1:  ANEMIA-IRON DEFICIENCY (ICD-280.9)  Her updated medication list for this problem includes:    Cvs Iron 325 (65 Fe) Mg Tabs (Ferrous sulfate) .Marland Kitchen... 1 tablet by mouth every day  Her updated medication list for this problem includes:    Cvs Iron 325 (65 Fe) Mg Tabs (Ferrous sulfate) .Marland Kitchen... 1 tablet by mouth every day  Problem # 2:  DIARRHEA (ICD-787.91)  Orders: Venipuncture (16109) TLB-BMP (Basic Metabolic Panel-BMET) (80048-METABOL) TLB-CBC Platelet - w/Differential (85025-CBCD)  Problem # 3:  HYPERTENSION (ICD-401.9)  Her  updated medication list for this problem includes:  Doxazosin Mesylate 8 Mg Tabs (Doxazosin mesylate) .Marland Kitchen... 1/2 once daily    Amlodipine Besylate 5 Mg Tabs (Amlodipine besylate) ..... One daily    Her updated medication list for this problem includes:    Doxazosin Mesylate 8 Mg Tabs (Doxazosin mesylate) .Marland Kitchen... 1/2 once daily    Amlodipine Besylate 5 Mg Tabs (Amlodipine besylate) ..... One daily  Orders: Venipuncture (47425) TLB-BMP (Basic Metabolic Panel-BMET) (80048-METABOL) TLB-CBC Platelet - w/Differential (85025-CBCD)  Complete Medication List: 1)  Doxazosin Mesylate 8 Mg Tabs (Doxazosin mesylate) .... 1/2 once daily 2)  Fosamax 70 Mg Tabs (Alendronate sodium) .... 1/2 q week 3)  Prilosec 20 Mg Cpdr (Omeprazole) .... Take 1 capsule by mouth every morning 4)  Calcium-vitamin D 600-200 Mg-unit Tabs (Calcium-vitamin d) .... 2 qd 5)  B Complex 100 Tabs (B complex vitamins) .... 2 qd 6)  Amlodipine Besylate 5 Mg Tabs (Amlodipine besylate) .... One daily 7)  Glucosamine-msm 500-400 Mg Caps (Glucosamine sulfate-msm) .... Take 2 capsules by mouth once daily 8)  Cvs Iron 325 (65 Fe) Mg Tabs (Ferrous sulfate) .Marland Kitchen.. 1 tablet by mouth every day 9)  Extra Strength Pain Reliever 500 Mg Tabs (Acetaminophen) .... Take three tablets by mouth once daily  Patient Instructions: 1)  Drink clear liquids only for the next 24 hours, then slowly add other liquids and food as you  tolerate them. 2)  immodium as needed for diarrhea 3)  call if worsens

## 2010-09-08 NOTE — Progress Notes (Signed)
Summary: Question regarding glucosemine, please call  Phone Note Call from Patient Call back at Home Phone [phone]   Caller: Patient Call For: Gordy Savers  MD Summary of Call: VM from pt with question regarding glucosemine, OV last week, feeling much better Initial call taken by: Sid Falcon LPN,  Dec 08, 4401 9:45 AM  Follow-up for Phone Call        called pt - ans'd question r/t different brand of glucosamin/chondrotin - ok to take but stop the other. KIK Follow-up by: Duard Brady LPN,  Dec 08, 4740 10:50 AM

## 2010-09-08 NOTE — Progress Notes (Signed)
Summary: pt having chest pains  Phone Note Call from Patient Call back at Home Phone [phone]   Caller: spouse-William Summary of Call: Pt is having chest pains and nausea since last night. No pain in arms. Some diarrhea.  Initial call taken by: Lucy Antigua,  November 30, 2009 11:33 AM  Follow-up for Phone Call        Phone busy x 3 . Follow-up by: Lynann Beaver CMA,  November 30, 2009 11:54 AM  Additional Follow-up for Phone Call Additional follow up Details #1::        Spoke to husband.Marland KitchenMarland KitchenRylynn Kobs is having diarrhea, nausea and aching through her ribs.  Declines ER visit.  Will come to the office to see Dr.K today. Additional Follow-up by: Lynann Beaver CMA,  November 30, 2009 12:04 PM

## 2010-09-08 NOTE — Assessment & Plan Note (Signed)
Summary: 2 wk rov/njr   Vital Signs:  Patient profile:   77 year old female Weight:      139 pounds Temp:     98.1 degrees F oral BP sitting:   118 / 70  (right arm) Cuff size:   regular  Vitals Entered By: Duard Brady LPN (April 15, 2010 10:30 AM) CC: 2 wk rov - doing good Is Patient Diabetic? No Flu Vaccine Consent Questions     Do you have a history of severe allergic reactions to this vaccine? no    Any prior history of allergic reactions to egg and/or gelatin? no    Do you have a sensitivity to the preservative Thimersol? no    Do you have a past history of Guillan-Barre Syndrome? no    Do you currently have an acute febrile illness? no    Have you ever had a severe reaction to latex? no    Vaccine information given and explained to patient? yes    Are you currently pregnant? no    Lot Number:[id number]   Exp Date:02/04/2011   Site Given  Left Deltoid IM    Primary Care Provider:  Marzella Schlein  CC:  2 wk rov - doing good.  History of Present Illness: 77 year old patient who is seen today for follow-up of her bilateral lower extremity edema and systolic heart failure.  Denies any shortness of breath, orthopnea, and her cough has improved with diuretic therapy.  She has lost 4 pounds over the past two weeks.  She, feels there's been some modest improvement in the lower extremity edema.  Amlodipine was discontinued two weeks ago.  Denies any chest pain.  BNP was 700  Allergies: 1)  ! Morphine Sulfate Cr (Morphine Sulfate) 2)  ! Codeine Phosphate (Codeine Phosphate)  Past History:  Past Medical History:  Hypertension Osteoporosis DJD Barrett's esophagus Glaucoma chronic kidney disease history of anemia Current Problems:  HIATAL HERNIA (ICD-553.3) ESOPHAGEAL STRICTURE (ICD-530.3) DIVERTICULOSIS, COLON (ICD-562.10) COLONIC POLYPS, ADENOMATOUS, HX OF (ICD-V12.72) GERD (ICD-530.81) RECTAL BLEEDING (ICD-569.3) DIARRHEA (ICD-787.91) ANEMIA OF  CHRONIC DISEASE (ICD-285.29) LEG EDEMA, BILATERAL (ICD-782.3) GLAUCOMA NOS (ICD-365.9) DEGENERATIVE JOINT DISEASE (ICD-715.90) BARRETT'S ESOPHAGUS (ICD-530.85) OSTEOPOROSIS (ICD-733.00) HYPERTENSION (ICD-401.9) systolic heart failure  Review of Systems       The patient complains of peripheral edema, prolonged cough, and difficulty walking.  The patient denies anorexia, fever, weight loss, weight gain, vision loss, decreased hearing, hoarseness, chest pain, syncope, dyspnea on exertion, headaches, hemoptysis, abdominal pain, melena, hematochezia, severe indigestion/heartburn, hematuria, incontinence, genital sores, muscle weakness, suspicious skin lesions, transient blindness, depression, unusual weight change, abnormal bleeding, enlarged lymph nodes, angioedema, and breast masses.    Physical Exam  General:  Well-developed,well-nourished,in no acute distress; alert,appropriate and cooperative throughout examination; 110/70 Head:  Normocephalic and atraumatic without obvious abnormalities. No apparent alopecia or balding. Mouth:  Oral mucosa and oropharynx without lesions or exudates.  Teeth in good repair. Neck:  No deformities, masses, or tenderness noted. Lungs:  dullness at both bases with a few crackles O2 saturation 97% Heart:  Normal rate and regular rhythm. S1 and S2 normal without gallop, murmur, click, rub or other extra sounds. rhythm regular in the mid 60s Abdomen:  Bowel sounds positive,abdomen soft and non-tender without masses, organomegaly or hernias noted. Extremities:  2+ left pedal edema and 2+ right pedal edema.  2+ left pedal edema.     Impression & Recommendations:  Problem # 1:  CONGESTIVE HEART FAILURE, SYSTOLIC DYSFUNCTION (ICD-428.20)  Her updated  medication list for this problem includes:    Furosemide 40 Mg Tabs (Furosemide) ..... One twice daily    Lisinopril 10 Mg Tabs (Lisinopril) ..... One daily    Carvedilol 6.25 Mg Tabs (Carvedilol) ..... One half  tablet twice daily  Orders: TLB-BMP (Basic Metabolic Panel-BMET) (80048-METABOL) Venipuncture (47829) Specimen Handling (56213)  Her updated medication list for this problem includes:    Furosemide 40 Mg Tabs (Furosemide) ..... One twice daily    Lisinopril 10 Mg Tabs (Lisinopril) ..... One daily    Carvedilol 6.25 Mg Tabs (Carvedilol) ..... One half tablet twice daily  Problem # 2:  ANEMIA-IRON DEFICIENCY (ICD-280.9)  Her updated medication list for this problem includes:    Cvs Iron 325 (65 Fe) Mg Tabs (Ferrous sulfate) .Marland Kitchen... 1 tablet by mouth every day  Her updated medication list for this problem includes:    Cvs Iron 325 (65 Fe) Mg Tabs (Ferrous sulfate) .Marland Kitchen... 1 tablet by mouth every day  Problem # 3:  LEG EDEMA, BILATERAL (ICD-782.3)  Her updated medication list for this problem includes:    Furosemide 40 Mg Tabs (Furosemide) ..... One twice daily  Her updated medication list for this problem includes:    Furosemide 40 Mg Tabs (Furosemide) ..... One twice daily  Complete Medication List: 1)  Fosamax 70 Mg Tabs (Alendronate sodium) .... 1/2 q week 2)  Prilosec 20 Mg Cpdr (Omeprazole) .... Take 1 capsule by mouth every morning 3)  Calcium-vitamin D 600-200 Mg-unit Tabs (Calcium-vitamin d) .... 2 qd 4)  B Complex 100 Tabs (B complex vitamins) .... 2 qd 5)  Cvs Iron 325 (65 Fe) Mg Tabs (Ferrous sulfate) .Marland Kitchen.. 1 tablet by mouth every day 6)  Extra Strength Pain Reliever 500 Mg Tabs (Acetaminophen) .... Take three tablets by mouth once daily 7)  Furosemide 40 Mg Tabs (Furosemide) .... One twice daily 8)  Lisinopril 10 Mg Tabs (Lisinopril) .... One daily 9)  Triple Flex 500-400-125 Mg Tabs (Glucosamine-chondroitin-msm) .... Qd 10)  Potassium Chloride Cr 10 Meq Cr-tabs (Potassium chloride) .... One twice daily 11)  Carvedilol 6.25 Mg Tabs (Carvedilol) .... One half tablet twice daily  Other Orders: Flu Vaccine 59yrs + MEDICARE PATIENTS (Y8657) Administration Flu vaccine - MCR  (Q4696)  Patient Instructions: 1)  Please schedule a follow-up appointment in 2 weeks. 2)  Limit your Sodium (Salt). 3)  keep legs elevated as much as possible 4)  Discontinue doxazosin Prescriptions: CARVEDILOL 6.25 MG TABS (CARVEDILOL) one half tablet twice daily  #90 x 0   Entered and Authorized by:   Gordy Savers  MD   Signed by:   Gordy Savers  MD on 04/15/2010   Method used:   Electronically to        [facility] [phone]* (retail)       [address]       [place], [place]  [address]       Ph: [phone]       Fax: [phone]   RxID:   [id number] POTASSIUM CHLORIDE CR 10 MEQ CR-TABS (POTASSIUM CHLORIDE) one twice daily  #180 x 0   Entered and Authorized by:   Gordy Savers  MD   Signed by:   Gordy Savers  MD on 04/15/2010   Method used:   Electronically to        [facility] [phone]* (retail)       [address]       [place], [place]  [address]  Ph: [phone]       Fax: [phone]   RxID:   [id number]

## 2010-09-08 NOTE — Progress Notes (Signed)
Summary: [name]  Phone Note Call from Patient   Caller: Patient Call For: Gordy Savers  MD Summary of Call: Saw Orthopedics and had leg wrapped in [facility], and will talk to Dr. Kirtland Bouchard about her meds at her next office visit. Initial call taken by: Lynann Beaver CMA,  Dec 20, 2009 3:38 PM  Follow-up for Phone Call        ok Follow-up by: Gordy Savers  MD,  Dec 20, 2009 5:09 PM

## 2010-09-08 NOTE — Miscellaneous (Signed)
  Clinical Lists Changes 

## 2010-10-18 ENCOUNTER — Other Ambulatory Visit: Payer: Self-pay | Admitting: Geriatric Medicine

## 2010-10-18 ENCOUNTER — Telehealth (INDEPENDENT_AMBULATORY_CARE_PROVIDER_SITE_OTHER): Payer: Self-pay | Admitting: *Deleted

## 2010-10-19 ENCOUNTER — Ambulatory Visit
Admission: RE | Admit: 2010-10-19 | Discharge: 2010-10-19 | Disposition: A | Discharge status: Home / Self Care | Payer: PRIVATE HEALTH INSURANCE | Source: Ambulatory Visit | Attending: Geriatric Medicine | Admitting: Geriatric Medicine

## 2010-10-19 ENCOUNTER — Encounter: Payer: Self-pay | Admitting: Pulmonary Disease

## 2010-10-19 ENCOUNTER — Ambulatory Visit
Admission: RE | Admit: 2010-10-19 | Discharge: 2010-10-19 | Disposition: A | Discharge status: Home / Self Care | Payer: Medicare Other | Source: Ambulatory Visit | Attending: Geriatric Medicine | Admitting: Geriatric Medicine

## 2010-10-19 ENCOUNTER — Other Ambulatory Visit: Payer: Self-pay

## 2010-10-19 ENCOUNTER — Institutional Professional Consult (permissible substitution) (INDEPENDENT_AMBULATORY_CARE_PROVIDER_SITE_OTHER): Payer: Medicare Other | Admitting: Pulmonary Disease

## 2010-10-19 DIAGNOSIS — I517 Cardiomegaly: Secondary | ICD-10-CM

## 2010-10-20 ENCOUNTER — Telehealth (INDEPENDENT_AMBULATORY_CARE_PROVIDER_SITE_OTHER): Payer: Self-pay | Admitting: *Deleted

## 2010-10-22 ENCOUNTER — Emergency Department (HOSPITAL_COMMUNITY): Payer: Medicare Other

## 2010-10-22 ENCOUNTER — Inpatient Hospital Stay (HOSPITAL_COMMUNITY)
Admission: EM | Admit: 2010-10-22 | Discharge: 2010-10-31 | DRG: 291 | Disposition: A | Discharge status: Skilled Nursing Facility | Payer: Medicare Other | Attending: Family Medicine | Admitting: Family Medicine

## 2010-10-22 DIAGNOSIS — F0390 Unspecified dementia without behavioral disturbance: Secondary | ICD-10-CM | POA: Diagnosis present

## 2010-10-22 DIAGNOSIS — N189 Chronic kidney disease, unspecified: Secondary | ICD-10-CM | POA: Diagnosis present

## 2010-10-22 DIAGNOSIS — I2589 Other forms of chronic ischemic heart disease: Secondary | ICD-10-CM | POA: Diagnosis present

## 2010-10-22 DIAGNOSIS — I5023 Acute on chronic systolic (congestive) heart failure: Principal | ICD-10-CM | POA: Diagnosis present

## 2010-10-22 DIAGNOSIS — L02419 Cutaneous abscess of limb, unspecified: Secondary | ICD-10-CM | POA: Diagnosis present

## 2010-10-22 DIAGNOSIS — K219 Gastro-esophageal reflux disease without esophagitis: Secondary | ICD-10-CM | POA: Diagnosis present

## 2010-10-22 DIAGNOSIS — K2270 Barrett's esophagus without dysplasia: Secondary | ICD-10-CM | POA: Diagnosis present

## 2010-10-22 DIAGNOSIS — N179 Acute kidney failure, unspecified: Secondary | ICD-10-CM | POA: Diagnosis not present

## 2010-10-22 DIAGNOSIS — I509 Heart failure, unspecified: Secondary | ICD-10-CM | POA: Diagnosis present

## 2010-10-22 DIAGNOSIS — L03119 Cellulitis of unspecified part of limb: Secondary | ICD-10-CM | POA: Diagnosis present

## 2010-10-22 DIAGNOSIS — I872 Venous insufficiency (chronic) (peripheral): Secondary | ICD-10-CM | POA: Diagnosis present

## 2010-10-22 DIAGNOSIS — Z23 Encounter for immunization: Secondary | ICD-10-CM

## 2010-10-22 DIAGNOSIS — Z96659 Presence of unspecified artificial knee joint: Secondary | ICD-10-CM

## 2010-10-22 DIAGNOSIS — I079 Rheumatic tricuspid valve disease, unspecified: Secondary | ICD-10-CM | POA: Diagnosis present

## 2010-10-22 DIAGNOSIS — M1990 Unspecified osteoarthritis, unspecified site: Secondary | ICD-10-CM | POA: Diagnosis present

## 2010-10-22 DIAGNOSIS — J189 Pneumonia, unspecified organism: Secondary | ICD-10-CM | POA: Diagnosis present

## 2010-10-22 DIAGNOSIS — I129 Hypertensive chronic kidney disease with stage 1 through stage 4 chronic kidney disease, or unspecified chronic kidney disease: Secondary | ICD-10-CM | POA: Diagnosis present

## 2010-10-22 DIAGNOSIS — I059 Rheumatic mitral valve disease, unspecified: Secondary | ICD-10-CM | POA: Diagnosis present

## 2010-10-22 DIAGNOSIS — Z79899 Other long term (current) drug therapy: Secondary | ICD-10-CM

## 2010-10-22 DIAGNOSIS — M810 Age-related osteoporosis without current pathological fracture: Secondary | ICD-10-CM | POA: Diagnosis present

## 2010-10-22 LAB — CBC
HCT: 32.4 % — ABNORMAL LOW (ref 36.0–46.0)
Hemoglobin: 10.6 g/dL — ABNORMAL LOW (ref 12.0–15.0)
MCH: 31.2 pg (ref 26.0–34.0)
MCV: 95.3 fL (ref 78.0–100.0)
Platelets: 160 10*3/uL (ref 150–400)
RBC: 3.4 MIL/uL — ABNORMAL LOW (ref 3.87–5.11)
RDW: 15.8 % — ABNORMAL HIGH (ref 11.5–15.5)

## 2010-10-22 LAB — DIFFERENTIAL
Basophils Absolute: 0 10*3/uL (ref 0.0–0.1)
Basophils Relative: 0 % (ref 0–1)
Lymphocytes Relative: 3 % — ABNORMAL LOW (ref 12–46)
Neutro Abs: 13.1 10*3/uL — ABNORMAL HIGH (ref 1.7–7.7)
Neutrophils Relative %: 93 % — ABNORMAL HIGH (ref 43–77)

## 2010-10-22 LAB — POCT CARDIAC MARKERS
CKMB, poc: 1.2 ng/mL (ref 1.0–8.0)
Myoglobin, poc: 123 ng/mL (ref 12–200)

## 2010-10-22 LAB — BASIC METABOLIC PANEL
CO2: 22 mEq/L (ref 19–32)
Chloride: 108 mEq/L (ref 96–112)
Glucose, Bld: 107 mg/dL — ABNORMAL HIGH (ref 70–99)

## 2010-10-23 ENCOUNTER — Inpatient Hospital Stay (HOSPITAL_COMMUNITY): Payer: Medicare Other

## 2010-10-23 DIAGNOSIS — I10 Essential (primary) hypertension: Secondary | ICD-10-CM

## 2010-10-23 DIAGNOSIS — J159 Unspecified bacterial pneumonia: Secondary | ICD-10-CM

## 2010-10-23 DIAGNOSIS — L02419 Cutaneous abscess of limb, unspecified: Secondary | ICD-10-CM

## 2010-10-23 DIAGNOSIS — L03119 Cellulitis of unspecified part of limb: Secondary | ICD-10-CM

## 2010-10-23 DIAGNOSIS — M7989 Other specified soft tissue disorders: Secondary | ICD-10-CM

## 2010-10-23 DIAGNOSIS — I509 Heart failure, unspecified: Secondary | ICD-10-CM

## 2010-10-23 LAB — DIFFERENTIAL
Basophils Absolute: 0 10*3/uL (ref 0.0–0.1)
Basophils Relative: 0 % (ref 0–1)
Eosinophils Absolute: 0 K/uL (ref 0.0–0.7)
Eosinophils Relative: 0 % (ref 0–5)
Lymphocytes Relative: 4 % — ABNORMAL LOW (ref 12–46)
Lymphs Abs: 0.5 K/uL — ABNORMAL LOW (ref 0.7–4.0)
Monocytes Absolute: 0.7 K/uL (ref 0.1–1.0)
Monocytes Relative: 5 % (ref 3–12)
Neutro Abs: 12.1 10*3/uL — ABNORMAL HIGH (ref 1.7–7.7)
Neutrophils Relative %: 91 % — ABNORMAL HIGH (ref 43–77)

## 2010-10-23 LAB — LIPID PANEL
Cholesterol: 97 mg/dL (ref 0–200)
HDL: 39 mg/dL — ABNORMAL LOW (ref >39)
LDL Cholesterol: 49 mg/dL (ref 0–99)
Total CHOL/HDL Ratio: 2.5 ratio
Triglycerides: 46 mg/dL (ref <150)
VLDL: 9 mg/dL (ref 0–40)

## 2010-10-23 LAB — CARDIAC PANEL(CRET KIN+CKTOT+MB+TROPI)
CK, MB: 2.2 ng/mL (ref 0.3–4.0)
CK, MB: 1.9 ng/mL (ref 0.3–4.0)
CK, MB: 2.7 ng/mL (ref 0.3–4.0)
Relative Index: INVALID (ref 0.0–2.5)
Relative Index: INVALID (ref 0.0–2.5)
Relative Index: INVALID (ref 0.0–2.5)
Total CK: 90 U/L (ref 7–177)
Total CK: 81 U/L (ref 7–177)
Total CK: 91 U/L (ref 7–177)
Troponin I: 0.03 ng/mL (ref 0.00–0.06)
Troponin I: 0.07 ng/mL — ABNORMAL HIGH (ref 0.00–0.06)
Troponin I: 0.06 ng/mL (ref 0.00–0.06)

## 2010-10-23 LAB — CBC
HCT: 28.4 % — ABNORMAL LOW (ref 36.0–46.0)
Hemoglobin: 9.2 g/dL — ABNORMAL LOW (ref 12.0–15.0)
MCH: 30.5 pg (ref 26.0–34.0)
MCHC: 32.4 g/dL (ref 30.0–36.0)
MCV: 94 fL (ref 78.0–100.0)
Platelets: 141 K/uL — ABNORMAL LOW (ref 150–400)
RBC: 3.02 MIL/uL — ABNORMAL LOW (ref 3.87–5.11)
RDW: 15.9 % — ABNORMAL HIGH (ref 11.5–15.5)
WBC: 13.3 10*3/uL — ABNORMAL HIGH (ref 4.0–10.5)

## 2010-10-23 LAB — TSH: TSH: 2.637 u[IU]/mL (ref 0.350–4.500)

## 2010-10-23 LAB — BASIC METABOLIC PANEL WITH GFR
BUN: 26 mg/dL — ABNORMAL HIGH (ref 6–23)
CO2: 21 meq/L (ref 19–32)
Calcium: 8.8 mg/dL (ref 8.4–10.5)
Creatinine, Ser: 2 mg/dL — ABNORMAL HIGH (ref 0.4–1.2)
GFR calc Af Amer: 29 mL/min — ABNORMAL LOW (ref >60)

## 2010-10-23 LAB — BASIC METABOLIC PANEL
Chloride: 108 mEq/L (ref 96–112)
GFR calc non Af Amer: 24 mL/min — ABNORMAL LOW (ref >60)
Glucose, Bld: 88 mg/dL (ref 70–99)
Potassium: 3.4 mEq/L — ABNORMAL LOW (ref 3.5–5.1)
Sodium: 140 mEq/L (ref 135–145)

## 2010-10-23 LAB — HEMOGLOBIN A1C
Hgb A1c MFr Bld: 6.1 % — ABNORMAL HIGH (ref <5.7)
Mean Plasma Glucose: 128 mg/dL — ABNORMAL HIGH (ref <117)

## 2010-10-24 LAB — CBC
HCT: 26.9 % — ABNORMAL LOW (ref 36.0–46.0)
Hemoglobin: 8.9 g/dL — ABNORMAL LOW (ref 12.0–15.0)
MCH: 31.1 pg (ref 26.0–34.0)
MCHC: 33.1 g/dL (ref 30.0–36.0)
MCV: 94.1 fL (ref 78.0–100.0)
Platelets: 142 10*3/uL — ABNORMAL LOW (ref 150–400)
RBC: 2.86 MIL/uL — ABNORMAL LOW (ref 3.87–5.11)
RDW: 15.9 % — ABNORMAL HIGH (ref 11.5–15.5)
WBC: 10.5 K/uL (ref 4.0–10.5)

## 2010-10-24 LAB — BASIC METABOLIC PANEL WITH GFR
BUN: 37 mg/dL — ABNORMAL HIGH (ref 6–23)
Chloride: 108 meq/L (ref 96–112)
GFR calc Af Amer: 23 mL/min — ABNORMAL LOW (ref >60)
GFR calc non Af Amer: 19 mL/min — ABNORMAL LOW (ref >60)
Potassium: 3.8 meq/L (ref 3.5–5.1)
Sodium: 136 meq/L (ref 135–145)

## 2010-10-24 LAB — BASIC METABOLIC PANEL
CO2: 22 mEq/L (ref 19–32)
Calcium: 8.4 mg/dL (ref 8.4–10.5)
Creatinine, Ser: 2.42 mg/dL — ABNORMAL HIGH (ref 0.4–1.2)
Glucose, Bld: 98 mg/dL (ref 70–99)

## 2010-10-25 LAB — COMPREHENSIVE METABOLIC PANEL
AST: 24 U/L (ref 0–37)
Albumin: 2.5 g/dL — ABNORMAL LOW (ref 3.5–5.2)
Chloride: 107 mEq/L (ref 96–112)
Creatinine, Ser: 2.55 mg/dL — ABNORMAL HIGH (ref 0.4–1.2)
GFR calc Af Amer: 22 mL/min — ABNORMAL LOW (ref >60)
Potassium: 3.6 mEq/L (ref 3.5–5.1)
Total Bilirubin: 1.1 mg/dL (ref 0.3–1.2)
Total Protein: 5.6 g/dL — ABNORMAL LOW (ref 6.0–8.3)

## 2010-10-25 LAB — CBC
MCH: 30.7 pg (ref 26.0–34.0)
Platelets: 150 10*3/uL (ref 150–400)
RBC: 2.96 MIL/uL — ABNORMAL LOW (ref 3.87–5.11)
WBC: 7.5 10*3/uL (ref 4.0–10.5)

## 2010-10-25 NOTE — Progress Notes (Signed)
Summary: consult appt  Phone Note From Other Clinic   Caller: Diane-[facility]cian Home visits--Dr Florentina Jenny Reason for Call: Schedule Patient Appt Summary of Call: Patient needing ASAP consult appointment for lung mass--rt hilar mass.  Diane [phone] Initial call taken by: Lehman Prom,  October 18, 2010 2:05 PM  Follow-up for Phone Call        Per [facility]- okay to add pt on at 2:45 tommorrow.  I called and spoke with Diane and made aware of this.  She states that the pt is supposed to have CT chest done at [facility] at 2 pm tommorrow, but she states that she will get this resched for in the am so that pt can come in at 2:30 to fill out consult form.  I advised that the pt should also bring the disc of her cxr and she verbalized understanding.  Follow-up by: Vernie Murders,  October 18, 2010 3:18 PM

## 2010-10-25 NOTE — Progress Notes (Signed)
Summary: dr Redmond School needs yesterdays note--faxed  Phone Note From Other Clinic   Caller: DIANE WITH DR Redmond School Call For: [facility] Summary of Call: Diane with Dr Carolyn Stare office phoned they need yesterdays note because they have to due an emergent referral to cardiology. She can be reached [phone] Also they sent two CDs with her yesterday and she needs to knowif we kept them of if we gave them back to the patient she will need those to take to the cardiologist Initial call taken by: Vedia Coffer,  October 20, 2010 10:53 AM  Follow-up for Phone Call        Diane is needing OV note from yesterday faxed becuase they need to refer the pt to cariology ASAP. They also want to know did we keep the cd-rom that the pt brought with her yesterday or did we give them back to the patient? Please advsie when note is signed and about cd's.  thanks. Carron Curie CMA  October 20, 2010 12:10 PM   Additional Follow-up for Phone Call Additional follow up Details #1::        I gave pt's caregiver/POA both discs yesterday.  Will forward message to [facility] regarding finishing pt's consult note.  Aundra Millet Reynolds LPN  October 20, 2010 12:22 PM     Additional Follow-up for Phone Call Additional follow up Details #2::    note done...send to dr tripp.  thanks.  Follow-up by: Barbaraann Share MD,  October 20, 2010 12:42 PM  Additional Follow-up for Phone Call Additional follow up Details #3:: Details for Additional Follow-up Action Taken: Called and informed Diane note was faxed and the pt was given her discs after the visit. she verbalized understanding Carver Fila  October 20, 2010 1:42 PM

## 2010-10-25 NOTE — Assessment & Plan Note (Signed)
Summary: consult for abnormal cxr   Visit Type:  Initial Consult Copy to:  Dr. Redmond School Primary Provider/Referring Provider:  Dr. Redmond School  CC:  Pulmonary Consult for abnormal cxr.  History of Present Illness: the pt is an 77y/o female who I have been asked to see for possible right hilar mass.  The pt has had worsening LE edema, and recently underwent cxr which showed right hilar fullness.  She underwent ct chest which showed very large heart, bilat effus. with compressive atx, large hiatal hernia, but no lung mass.  The pt denies worsening sob, but she admits she doesn't do a lot of activity.  She has only mild cough, but no mucus production.  Has no chest pain or hemoptysis, and has never had cardiac w/u per her history.  She does have a h/o dvt, but doppler exam in 2009 negative.    Current Medications (verified): 1)  Prilosec 20 Mg Cpdr (Omeprazole) .... Take 1 Capsule By Mouth Every Morning 2)  B Complex 100   Tabs (B Complex Vitamins) .... 2 Qd 3)  Cvs Iron 325 (65 Fe) Mg Tabs (Ferrous Sulfate) .Marland Kitchen.. 1 Tablet By Mouth Every Day 4)  Extra Strength Pain Reliever 500 Mg Tabs (Acetaminophen) .... Take Three Tablets By Mouth Once Daily 5)  Lisinopril 10 Mg Tabs (Lisinopril) .... One Daily 6)  Claritin 10 Mg Tabs (Loratadine) .... Take 1 Tablet By Mouth Once A Day 7)  Mucinex 600 Mg Xr12h-Tab (Guaifenesin) .... Take 1 Tablet By Mouth Two Times A Day  Allergies (verified): 1)  ! Morphine Sulfate Cr (Morphine Sulfate) 2)  ! Codeine Phosphate (Codeine Phosphate)  Past History:  Past Medical History: Hypertension Osteoporosis DJD Barrett's esophagus Glaucoma chronic kidney disease history of anemia HIATAL HERNIA (ICD-553.3) ESOPHAGEAL STRICTURE (ICD-530.3) GLAUCOMA NOS (ICD-365.9) DEGENERATIVE JOINT DISEASE (ICD-715.90 OSTEOPOROSIS (ICD-733.00) HYPERTENSION (ICD-401.9) systolic heart failure  Past Surgical History: Reviewed history from 09/16/2009 and no changes  required. Appendectomy Cataract extraction Total knee replacement Oophorectomy colonoscopy 2004, 2009 EGD 08-2008  Family History: Reviewed history from 05/02/2010 and no changes required. both parents died of an apparent MI.  Father, age 10 mother age 76 4 brothers.  History prostate cancer x 4 brothers .  He is one status post CABG Family history also positive for cerebral vascular disease and colon cancer No FH of Colon Cancer:  Social History: Reviewed history from 07/16/2008 and no changes required. 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 Patient has never smoked.  pt is married. Alcohol Use - no Illicit Drug Use - no  Review of Systems       The patient complains of shortness of breath with activity, productive cough, acid heartburn, indigestion, nasal congestion/difficulty breathing through nose, itching, hand/feet swelling, and joint stiffness or pain.  The patient denies shortness of breath at rest, non-productive cough, coughing up blood, chest pain, irregular heartbeats, loss of appetite, weight change, abdominal pain, difficulty swallowing, sore throat, tooth/dental problems, headaches, sneezing, ear ache, anxiety, depression, rash, change in color of mucus, and fever.    Vital Signs:  Patient profile:   77 year old female Height:      62 inches Weight:      145.38 pounds BMI:     26.69 O2 Sat:      99 % on Room air Pulse rate:   76 / minute BP sitting:   124 / 78  (left arm) Cuff size:   regular  Vitals Entered By: Arman Filter LPN (October 19, 2010 3:14 PM)  O2 Flow:  Room air CC: Pulmonary Consult for abnormal cxr Comments Medications reviewed with patient Arman Filter LPN  October 19, 2010 3:25 PM    Physical Exam  General:  frail female in nad  Eyes:  right pupil >> left post surgery, EOMI Nose:  patent without discharge, no purulence seen  Mouth:  clear, no exudates Neck:  no jvd, tmg, LN  Lungs:  decreased bs both bases,  with mild crackles no wheezing noted  Heart:  irreg rate and rhythm, cvr 2/6 sem  Abdomen:  soft and nontender, bs+ Extremities:  2+ edema bilat, no cyanosis  pulses decreased distally no calf tenderness.  Neurologic:  alert and oriented, moves all 4    Impression & Recommendations:  Problem # 1:  CARDIOMEGALY (ICD-429.3)  the pt's cxr showed possible right hilar mass vs vascular structure, and ct chest reveals no hilar mass.  She does have a very large heart with bilat effusions, and basilar atx L>>R due to effusions.  She also has a large hiatal hernia.  On exam today, she has an irregular heart rate that is suspicious for afib.  I have spoken to the nurse practioner for her primary md, and have recommended a cardiac workup.  If this is unrevealing, would consider LE venous dopplers to r/o dvt, though this would not explain her very large cardiac silhouette.    Medications Added to Medication List This Visit: 1)  Claritin 10 Mg Tabs (Loratadine) .... Take 1 tablet by mouth once a day 2)  Mucinex 600 Mg Xr12h-tab (Guaifenesin) .... Take 1 tablet by mouth two times a day  Other Orders: Consultation Level IV (16109)  Patient Instructions: 1)  will send a note to Dr. Redmond School recommending a cardiac workup.

## 2010-10-26 LAB — URINALYSIS, ROUTINE W REFLEX MICROSCOPIC
Glucose, UA: NEGATIVE mg/dL
Hgb urine dipstick: NEGATIVE
Specific Gravity, Urine: 1.014 (ref 1.005–1.030)
pH: 5 (ref 5.0–8.0)

## 2010-10-26 LAB — CBC
HCT: 26.9 % — ABNORMAL LOW (ref 36.0–46.0)
Hemoglobin: 9 g/dL — ABNORMAL LOW (ref 12.0–15.0)
MCV: 93.7 fL (ref 78.0–100.0)
RDW: 15.7 % — ABNORMAL HIGH (ref 11.5–15.5)
WBC: 5.9 10*3/uL (ref 4.0–10.5)

## 2010-10-26 LAB — BASIC METABOLIC PANEL
BUN: 41 mg/dL — ABNORMAL HIGH (ref 6–23)
CO2: 23 mEq/L (ref 19–32)
Chloride: 109 mEq/L (ref 96–112)
GFR calc non Af Amer: 17 mL/min — ABNORMAL LOW (ref >60)
Glucose, Bld: 103 mg/dL — ABNORMAL HIGH (ref 70–99)
Potassium: 3.4 mEq/L — ABNORMAL LOW (ref 3.5–5.1)

## 2010-10-26 LAB — CREATININE, URINE, RANDOM: Creatinine, Urine: 63 mg/dL

## 2010-10-26 LAB — SODIUM, URINE, RANDOM: Sodium, Ur: 59 mEq/L

## 2010-10-27 LAB — BASIC METABOLIC PANEL
BUN: 41 mg/dL — ABNORMAL HIGH (ref 6–23)
CO2: 21 mEq/L (ref 19–32)
Calcium: 8.4 mg/dL (ref 8.4–10.5)
Creatinine, Ser: 2.57 mg/dL — ABNORMAL HIGH (ref 0.4–1.2)
Glucose, Bld: 102 mg/dL — ABNORMAL HIGH (ref 70–99)

## 2010-10-27 LAB — CBC
HCT: 29.8 % — ABNORMAL LOW (ref 36.0–46.0)
Hemoglobin: 9.8 g/dL — ABNORMAL LOW (ref 12.0–15.0)
MCH: 31 pg (ref 26.0–34.0)
MCHC: 32.9 g/dL (ref 30.0–36.0)
MCV: 94.3 fL (ref 78.0–100.0)

## 2010-10-28 LAB — CBC
HCT: 28.6 % — ABNORMAL LOW (ref 36.0–46.0)
MCH: 31.3 pg (ref 26.0–34.0)
MCHC: 33.2 g/dL (ref 30.0–36.0)
MCV: 94.1 fL (ref 78.0–100.0)
RDW: 15.3 % (ref 11.5–15.5)
WBC: 6.2 10*3/uL (ref 4.0–10.5)

## 2010-10-28 LAB — COMPREHENSIVE METABOLIC PANEL
Alkaline Phosphatase: 61 U/L (ref 39–117)
BUN: 36 mg/dL — ABNORMAL HIGH (ref 6–23)
Glucose, Bld: 90 mg/dL (ref 70–99)
Potassium: 4.3 mEq/L (ref 3.5–5.1)
Total Protein: 5.9 g/dL — ABNORMAL LOW (ref 6.0–8.3)

## 2010-10-29 LAB — CULTURE, BLOOD (ROUTINE X 2)
Culture  Setup Time: 201203181158
Culture: NO GROWTH

## 2010-10-29 LAB — BASIC METABOLIC PANEL
BUN: 35 mg/dL — ABNORMAL HIGH (ref 6–23)
Chloride: 102 mEq/L (ref 96–112)
GFR calc Af Amer: 22 mL/min — ABNORMAL LOW (ref >60)
Potassium: 3.9 mEq/L (ref 3.5–5.1)

## 2010-10-29 LAB — CBC
MCV: 94.3 fL (ref 78.0–100.0)
Platelets: 224 10*3/uL (ref 150–400)
RBC: 3.35 MIL/uL — ABNORMAL LOW (ref 3.87–5.11)
WBC: 6.2 10*3/uL (ref 4.0–10.5)

## 2010-10-30 LAB — BASIC METABOLIC PANEL
BUN: 37 mg/dL — ABNORMAL HIGH (ref 6–23)
CO2: 30 mEq/L (ref 19–32)
CO2: 32 mEq/L (ref 19–32)
Chloride: 103 mEq/L (ref 96–112)
GFR calc non Af Amer: 20 mL/min — ABNORMAL LOW (ref >60)
Glucose, Bld: 94 mg/dL (ref 70–99)
Glucose, Bld: 147 mg/dL — ABNORMAL HIGH (ref 70–99)
Potassium: 3.4 mEq/L — ABNORMAL LOW (ref 3.5–5.1)
Potassium: 3.7 mEq/L (ref 3.5–5.1)
Sodium: 139 mEq/L (ref 135–145)

## 2010-10-30 LAB — CBC
HCT: 29.7 % — ABNORMAL LOW (ref 36.0–46.0)
Hemoglobin: 9.8 g/dL — ABNORMAL LOW (ref 12.0–15.0)
MCV: 93.1 fL (ref 78.0–100.0)
RBC: 3.19 MIL/uL — ABNORMAL LOW (ref 3.87–5.11)
WBC: 5.5 10*3/uL (ref 4.0–10.5)

## 2010-10-30 NOTE — Consult Note (Signed)
NAMEJORDEN, MAHL NO.:  [id number]  MEDICAL RECORD NO.:  [id number]           PATIENT TYPE:  E  LOCATION:  [facility]                         FACILITY:  [facility]  PHYSICIAN:  Dyke Maes, M.D.DATE OF BIRTH:  18-Aug-1921  DATE OF CONSULTATION:  10/26/2010 DATE OF DISCHARGE:                                CONSULTATION   CONSULTING TEAM:  [facility], Dr. Denny Levy.  HISTORY OF PRESENT ILLNESS:  An 77 year old female with a history of CHF presents with cough, increasing edema up to the abdomen and face. Denies any fever.  Upon admission, she was diagnosed with CHF exacerbation and pneumonia.  She was given Lasix, which helped her dyspnea somewhat and her creatinine increased from around 2 to 2.7. Cardiology consult recommend and renal consult.  PAST MEDICAL HISTORY: 1. CKD. 2. CHF with EF 45%. 3. Mitral and tricuspid regurgitation. 4. Hypertension. 5. Coronary artery disease. 6. Anemia. 7. DJD and arthritis. 8. Venous stasis and ulcer in right leg.  PAST SURGICAL HISTORY: 1. Appendectomy. 2. Cataract surgeries bilaterally. 3. Left total knee replacement. 4. Colonoscopy. 5. Oophorectomy.  MEDICATIONS: 1. Coreg 6.25 twice daily. 2. Spironolactone 12.5 daily. 3. Keflex 250 x10 days three times a day. 4. Prilosec 20. 5. Calcium plus vitamin D 600/200 two tablets daily. 6. Iron sulfate 325 daily. 7. Lasix 40 oral daily.  SOCIAL HISTORY:  Lives with her husband, her sister-in-law, Earl Gala comes with most of her visits, her phone number is [phone].  No alcohol, tobacco, or drugs.  FAMILY HISTORY:  Mom died of a MI at 50.  Father of an MI at 93. Brother had an MI in his 59s.  REVIEW OF SYSTEMS:  Please see above.  No fevers or chills, long-term leg swelling for about a year, venous stasis ulcer as above, some shortness of breath as above, otherwise feels pretty well.  PHYSICAL EXAMINATION:  VITAL SIGNS:  Temperature 98.6,  heart rate 78, respiratory rate 18, blood pressure 107/73, saturating 98% on room air. GENERAL:  Elderly woman with family in the room, well cared for, no acute distress. HEENT:  Clear sclerae.  EOMI.  Moist mucous membranes and dilated neck veins to the angle of her jaw. HEART:  Regular rhythm.  She has got significant 3/6 diastolic murmur. LUNGS:  Clear to auscultation bilaterally. SKIN:  Bilateral leg show chronic venous stasis changes and ulcer on her right posterior calf that is clear to intact dressing. GI:  Normoactive bowel sounds, soft, nontender with edema up to her midabdomen on her skin. EXTREMITIES:  2+ edema up to her abdomen and in the dependent areas chronic changes. NEURO:  Alert and oriented.  LABORATORY DATA AND STUDIES:  Chest x-ray shows bilateral pleural effusions, atelectasis.  Her CBC shows white count 5.9, hemoglobin 9.0, platelet of 172.  BMET significant for a BUN of 41 and creatinine of 2.71.  Her creatinine trend was 1.94 to 2.0 to 2.42 to 2.55 to 2.71 today.  Calcium is 8.3, mag is 1.7, phos 3.0, albumin 2.5.  Beta- natriuretic peptide is 1004.  TSH 2.637.  A1c is 6.1.  Blood cultures negative x2.  Echo on the 19th shows 45% to 50% with akinesis of the inferior consistent with infarct, mitral regurgitation moderate to severe, and severe tricuspid regurgitation, dilated inferior vena cava consistent with increased venous pressure.  ASSESSMENT/PLAN:  An 77 year old female with congestive heart failure and severe tricuspid regurgitation who presents with cough now acute on chronic kidney disease. 1. Acute on chronic kidney disease.  I think due to right heart     disease and some hyperperfusion of the kidneys.  Abdomen CT scan on     the 14th showed no hydronephrosis.  Plan:  Urinalysis, urine     electrolytes to calculate and infarction secretion of urea.  Stop     her Coreg to improve her blood pressure to allow for further     perfusions.  Continue to  not give Aldactone.  We will increase her     diuresis with Lasix 80 IV q.8 h. because we suspect     that edema.  Provide a low-salt diet 2 g and a clear water fluid     restriction of 1200 mL daily and place Foley catheter to follow     incidents.     She should never be on ACE inhibitors, angiotensin     receptor blockers, or NSAIDs for the rest of her life.  We will     follow her creatinine trend hope to improve with increased     diuresis.  2. Anemia on oral iron, uncertain of prior workup.  Plan to check iron     studies.  We will follow. 3. Congestive heart failure per Cardiology.  Discussed with Dr.     Anne Fu.  I feel like she has got mostly right-sided disease and we     will treat with diuresis as above and hopefully stopping Coreg to     increase perfusion.  We will follow.     Clementeen Graham, MD   ______________________________ Dyke Maes, M.D.    EC/MEDQ  D:  10/26/2010  T:  10/27/2010  Job:  [id number]  Electronically Signed by Clementeen Graham  on 10/27/2010 03:29:01 PM Electronically Signed by Primitivo Gauze M.D. on 10/30/2010 03:33:20 PM

## 2010-10-31 LAB — CBC
HCT: 29.8 % — ABNORMAL LOW (ref 36.0–46.0)
Hemoglobin: 9.9 g/dL — ABNORMAL LOW (ref 12.0–15.0)
MCH: 30.6 pg (ref 26.0–34.0)
MCHC: 33.2 g/dL (ref 30.0–36.0)
RBC: 3.24 MIL/uL — ABNORMAL LOW (ref 3.87–5.11)

## 2010-10-31 LAB — BASIC METABOLIC PANEL
CO2: 32 mEq/L (ref 19–32)
Calcium: 9.7 mg/dL (ref 8.4–10.5)
Chloride: 96 mEq/L (ref 96–112)
Glucose, Bld: 87 mg/dL (ref 70–99)
Sodium: 139 mEq/L (ref 135–145)

## 2010-10-31 NOTE — Consult Note (Signed)
NAMEANIS, CINELLI                ACCOUNT NO.:  [id number]  MEDICAL RECORD NO.:  [id number]           PATIENT TYPE:  LOCATION:                                 FACILITY:  PHYSICIAN:  Jake Bathe, MD      DATE OF BIRTH:  [birth year]  DATE OF CONSULTATION:  10/26/2010 DATE OF DISCHARGE:                                CONSULTATION   REASON FOR CONSULTATION:  Evaluation of lower extremity swelling, systolic heart failure in the setting worsening renal function.  HISTORY OF PRESENT ILLNESS:  Ms. Reifschneider is a pleasant 77 year old female taken care by Dr. Redmond School who presented to the hospital with creatinine of 2.0, increasing cough, moderate pleural effusion on chest x-ray who was admitted for worsening heart failure.  Echocardiogram was performed which demonstrated severe tricuspid regurgitation, dilated IVC, dilated right ventricle, and an ejection fraction of her left ventricle in the 40% to 50% range, mildly reduced.  While here in the hospital, her home dose of Lasix 40 mg once a day has been increased up to 80 mg IV; however, she has not had too much success at diuresis overall and her weight has remained stable.  As an outpatient, she was on spironolactone which has been discontinued due to her poor creatinine clearance.  Currently, she is sitting up in a chair quite comfortable with chronic lower extremity edema which is somewhat improved; however, her blood pressures have been in the low 100s, occasionally upper 90s which has been challenging to allow for further diuresis in addition to her creatinine increasing from 2.0 up to 2.7.  Both Cardiology as well as Nephrology were consulted for evaluation.  PAST MEDICAL HISTORY: 1. Chronic kidney disease. 2. Anemia. 3. Osteoarthritis. 4. DJD. 5. Osteoporosis. 6. Hypertension. 7. Tricuspid regurgitation, severe. 8. Moderate-to-severe mitral regurgitation.  PAST SURGICAL HISTORY: 1. Appendectomy. 2. Total knee  replacement. 3. Oophorectomy. 4. Colonoscopy.  SOCIAL HISTORY:  Lives with her husband at home.  No tobacco.  No alcohol.  No drug use.  FAMILY HISTORY:  Mother had MI at age 42.  Father had MI at age 37. Brother had MI at age 74.  REVIEW OF SYSTEMS:  Increased lower extremity swelling recently, anasarca.  Unless specified above, all other 12 review of systems negative.  Medications at home include: 1. Coreg 6.25 mg twice a day. 2. Spironolactone 12.5 mg once a day. 3. She has been on Keflex for lower extremity. 4. Lasix 40 mg once a day. 5. Claritin. 6. Mucinex. 7. Calcium with vitamin D.  PHYSICAL EXAMINATION:  VITAL SIGNS:  On arrival, her pulse was 104, temperature 98, respirations 18, blood pressure was 131/77, currently in the low 100s, upper 90s, sating 99% on room air. GENERAL:  She is alert and oriented x3, elderly-appearing female, resting comfortably in her chair, sitting up, husband at bedside as well as many family members. EYES:  Pale conjunctivae.  EOMI.  No scleral icterus. NECK:  Jugular venous distention noted especially in the external jugular.  No thyromegaly.  No carotid bruits.  Thin, cachectic. CARDIOVASCULAR:  She is regular rate and rhythm with occasional bursts  of paroxysmal atrial tachycardia and occasional ectopic beats such as PVCs.  She has a 2/6 systolic murmur heard best at left lower sternal border and apex. LUNGS:  She had decreased breath sounds in her left lung bases, otherwise no wheezes, no rales.  Moderately decreased respiratory effort. ABDOMEN:  Soft, nontender, mildly protuberant.  No bruits. EXTREMITIES:  She has 2+ pitting edema bilateral extremities which reportedly has improved.  Mild warmth, left greater than right extremity.  Difficult to palpate distal pulses. PSYCHIATRIC:  Normal affect. GU:  Deferred. RECTAL:  Deferred.  She uses a bedside commode for urine measurement.  EKG demonstrates sinus rhythm, rate 74, with  frequent PAC/occasional PVCs.  Telemetry notes this as well, low voltage but no evidence of pericardial effusion.  Echocardiogram as described above with ejection fraction left sided in the 45% to 50% range and mild aortic regurgitation, moderate-to-severe mitral regurgitation with a calcified annulus, mildly dilated right ventricle, moderately dilated right atrium, severe tricuspid regurgitation, and left pleural effusion noted. No pericardial effusion.  Albumin was 2.3.  TSH was normal.  BUN and creatinine has increased.  BNP is currently 1004 down from 1206.  Blood cultures have shown no growth to date.  ASSESSMENT AND PLAN:  An 77 year old female with right greater than left ventricular systolic dysfunction, acute systolic heart failure, with chronic lower extremity edema, chronic anemia, left pleural effusion, paroxysmal atrial tachycardia, and worsening chronic renal insufficiency/renal failure.  PLAN:  Very challenging situation given her overall right ventricular systolic dysfunction given her moderately dilated right atrium with her dilated IVC noted.  Patients such as Ms. Henkes are very challenging in the sense that diuresis can lead to intravascular volume depletion quite rapidly leading to increased BUN and creatinine.  Our main objective here is to improve her quality of life by decreasing her overall fluid burden which at this point is mostly extracellular in her lower extremities.  I have conferred with Dr. Briant Cedar and his team and we agree with discontinuation of spironolactone given the dangerous implications of this, perhaps hyperkalemia in the setting of chronic renal insufficiency.  Also with her lower blood pressures, we have decided to decrease her carvedilol from her hospital dose of 3.125 twice a day down to discontinue it altogether.  Perhaps increasing her blood pressure overall will help increase perfusion to her kidneys which will overall decrease her  total fluid volume.  Encourage compression stockings.  I would avoid ACE inhibitors, ARBs.  I would also avoid digoxin due to the increased possibility of toxicity with her creatinine clearance.  Certainly, her mitral valve/left-sided dysfunction is contributing to her right-sided dysfunction.  She has no history of smoking and no prior history of pulmonary embolism.  This is once again a challenging situation and we will continue to hopefully improve her overall volume status with the assistance of Nephrology as well.  Of course given her advanced age, dialysis is not an option nor is valve replacement or repair.  We will follow along with you.     Jake Bathe, MD     MCS/MEDQ  D:  10/26/2010  T:  10/27/2010  Job:  [id number]  Electronically Signed by Donato Schultz MD on 10/31/2010 02:38:09 PM

## 2010-11-08 NOTE — Discharge Summary (Signed)
[name], CHEA NO.:  [id number]  MEDICAL RECORD NO.:  [id number]           PATIENT TYPE:  E  LOCATION:  [facility]                         FACILITY:  [facility]  PHYSICIAN:  Nestor Ramp, MD        DATE OF BIRTH:  1922/01/22  DATE OF ADMISSION:  10/22/2010 DATE OF DISCHARGE:  10/31/2010                              DISCHARGE SUMMARY   ATTENDING PHYSICIAN:  Huntley Dec L. Jennette Kettle, MD  PRIMARY CARE PROVIDER:  [facility]  DISCHARGE DIAGNOSES: 1. Acute congestive heart failure exacerbation. 2. Acute on chronic renal insufficiency. 3. Community-acquired pneumonia. 4. Right lower extremity cellulitis. 5. Osteoporosis. 6. History of anemia. 7. Gastroesophageal reflux disease.  DISCHARGE MEDICATIONS: 1. Aspirin 81 mg 1 tablet by mouth daily. 2. Doxycycline 100 mg 1 tablet by mouth twice daily, take for 9 more     days.  Stop date November 09, 2010. 3. Lasix 80 mg take 2 tablets by mouth 3 times a day.  Special    instructions, the patient is to take 2 tablets at 7:00 a.m., noon,     and 4 o'clock p.m. 4. Crestor 5 mg 1 tablet by mouth at bedtime. 5. Calcium citrate with vitamin D one tablet by mouth daily. 6. Loratadine 10 mg 1 tablet by mouth daily as needed for allergies. 7. Iron 325 mg 1 tablet by mouth daily. 8. Prilosec 20 mg 1 tablet by mouth daily. 9. Tylenol Extra Strength 1 tablet by mouth 3 times a day as needed     for leg pain. 10.Vitamin B complex 2 tablets by mouth daily.  Stop taking the following medications: 1. Coreg 6.25 mg. 2. Spironolactone 25 mg. 3. Keflex 250 mg 4. Lasix 40 mg.  CONSULTATIONS:  Nephrology.  RECOMMENDATIONS: 1. To take Lasix 80 mg 2 tablets at 7:00 a.m., noon, and 4 p.m.  The     patient should never be on ACE inhibitors or NSAIDs.     The patient should also avoid Coreg and Aldactone. 2. Cardiology recommendations: The patient should avoid ACE inhibitors, ARBs, and     digoxin due to the increased possibility of  toxicity with her     creatinine clearance.  Cardiology also recommends discontinuing     Coreg altogether.  PROCEDURES WITH DATE:   On October 19, 2010, a chest x-ray 2-view showed: 1. Fullness of the right hilum although possibly related to the     pulmonary vessels. 2. Left lower lobe consolidation, may represent pleural fluid,     atelectasis, or infiltrate, hiatal hernia, and a small right-sided     pleural effusion, cardiomegaly, tortuous calcified aorta.  Chest CT without contrast showed: 1. Left lower lobe opacity consistent with atelectasis or pneumonia. 2. Moderate sized pleural bilateral effusions. 3. No hilar mass seen.  No adenopathy noted. 4. Cardiomegaly and coronary artery calcifications. 5. Sturdiness of the soft tissues may indicate fluid overload.  CT abdomen and pelvis without contrast showed: 1. No pelvic mass identified. 2. Third spacing of fluid was subcu infiltration, mesenteric     infiltration, small amount of free fluid in  the abdomen, pelvis,     and bilateral pleural effusions larger on the left. October 22, 2010: repeat chest x-ray 2-view showed bilateral basilar infiltrates and effusions with mild progression on the left.  A right tibia and fibula x-ray showed degenerative changes of the knee and ankle, soft tissue swelling.  No acute bony abnormalities identified. A repeat chest x-ray on March 18 showed stable effusions, but improved bibasilar atelectasis versus airspace disease.  PERTINENT LABORATORY DATA AT DISCHARGE:  CBC showed a white count of 7.4, hemoglobin 9.9, hematocrit 29.8, platelet count 246.  A BMET showed a sodium of 139, potassium 3.4, chloride 96, CO2 of 32, BUN 35, creatinine 2.32, and glucose 87.  Magnesium 1.6, phosphorus 3.7.  TSH was 2.637, hemoglobin A1c was 6.1.  Lipid profile:  Cholesterol 97, triglycerides 46, HDL 39, LDL 49.  BRIEF HOSPITAL COURSE:  This is an 77 year old female with a history of CHF who presented with  cough, increasing edema up to the abdomen and face.  The patient was afebrile upon admission.  She was diagnosed with CHF exacerbation and pneumonia.  The patient denied any chest pain, palpitations, PND, or orthopnea.  She also had increasing erythema of her right leg over the past few days, was started on Keflex with no improvement over a 2-day course. 1. Acute CHF exacerbation.  Upon admission, physical exam and chest x-     ray were consistent with fluid overload.  Her beta natriuretic     peptide was also elevated but this seems to be chronically     elevated.  The patient was admitted to telemetry bed and cardiac     enzymes were cycled and negative x3.  The patient was started on     Lasix 40 mg IV daily at first, then she was increased to Lasix 80     mg IV daily.  The patient's daily weights and in's and out's were     not improving on the Lasix 80 mg IV, therefore Cardiology and Renal     were consulted.  Renal recommended that the patient be started on     Lasix 80 mg IV q.8 h.  The patient did very well on this dose.  Her     output was > 2 L daily and her standing weights decreased from 67 kg to     about 57 kg on the day of discharge.  The patient's swelling in her     bilateral lower extremities, face, and hands resolved.  On physical     exam, there is only trace edema of the right lower extremity and no     edema in the left lower extremity.  The patient will be sent home     on Lasix 160 mg 3 times a day.  The patient is to follow up with     her primary doctor, Dr. Redmond School after her short-term rehab at a     skilled nursing facility.  The patient's home meds of Aldactone,     Coreg, an ACE inhibitor have been discontinued.  On March 19, a 2-D     echo showed an EF of 45-50%, mild aortic valve regurgitation,     moderate-to-severe mitral valve regurgitation, severe tricuspid     valve regurgitation.  Cardiology consult and Renal     consults were appreciated.  At the time  of discharge, the patient's     dry weight was back to baseline.  The patient was not complaining  of any shortness of breath, chest pain, or swelling.  The patient     was not hypoxic throughout the hospital course.   2. Community-acquired pneumonia. On admission, patient was treated     with Avelox.  The patient remained afebrile throughout the hospital     course.  The patient was on Avelox 400 mg 1 tablet by mouth     daily for a 7-day course. 3. Acute on chronic renal insufficiency.  On admission, the patient's     creatinine was around baseline which was about 1.9; however, as we     started her on Lasix, the patient's creatinine began to increase.     At that time, Renal was consulted.  They started Lasix 80 mg q.8 hrs.     The patient's creatinine remained stable     at 2.6 for a few days and then finally on the last day of     discharge, the patient's creatinine was 2.3 which seemed to be her     new baseline. 4. Right lower extremity cellulitis.  Wound care nurse was consulted     who recommended clean dressings Monday, Wednesday, Friday.  The     patient was started on doxycycline 100 mg p.o. b.i.d. for a 14-day     course.  She will continue to stay on doxycycline until November 09, 2010. 5. Osteoporosis.  The patient was continued on her calcium plus D     supplement. 6. History of anemia.  Renal ordered an iron panel and started the     patient on Feraheme.  The patient is to be discharged to SNF on her     oral iron supplements. 7. GERD.  The patient continued on PPI.  Speech Pathology was     consulted due to complaints of globus sensation.  Per Speech     Therapy, the patient is able to eat a regular diet with safety and     GERD precautions.  The patient is to continue her PPI omeprazole 20     mg 1 tablet p.o. once daily.  If her GERD symptoms worsen, she may     take 2 tablets daily.  DISCHARGE INSTRUCTIONS:  Activity ad lib per SNF.  DIET:  Low-sodium,  heart-healthy regular diet with safe swallowing and reflux precautions.  WOUND CARE:  Clean.  Normal saline, pat dry.  Apply small piece of number [id number] then top that with number [id number].  Change Monday, Wednesday, Friday.  FOLLOWUP APPOINTMENTS:  [facility] Redmond [facility]. SNF is to schedule appointment time and date.  SPECIAL INSTRUCTIONS:  Avoid straining.  Stop any activity that causes chest pain, shortness of breath, dizziness, sweating, or excessive weakness.  If you have heart failure, record daily weights, and review the special instructions on the back of this page.  DISCHARGE CONDITION:  The patient was discharged to SNF in stable medical condition.   ______________________________ Barnabas Lister, MD   ______________________________ Nestor Ramp, MD   ID/MEDQ  D:  10/31/2010  T:  10/31/2010  Job:  [id number]  cc:   [facility] Red[facility]  Electronically Signed by Barnabas Lister MD on 11/01/2010 03:08:31 PM Electronically Signed by Denny Levy MD on 11/08/2010 02:32:42 PM

## 2010-11-08 NOTE — H&P (Signed)
NAMESIANI, UTKE                ACCOUNT NO.:  [id number]  MEDICAL RECORD NO.:  [id number]           PATIENT TYPE:  E  LOCATION:  [facility]                         FACILITY:  [facility]  PHYSICIAN:  Nestor Ramp, MD        DATE OF BIRTH:  1922/01/21  DATE OF ADMISSION:  10/22/2010 DATE OF DISCHARGE:                             HISTORY & PHYSICAL   PRIMARY CARE PHYSICIAN:  Dr. Redmond School.  CHIEF COMPLAINT:  Cough.  HISTORY OF PRESENT ILLNESS:  This is an 77 year old female with past medical history of CHF here with persistent cough.  She states she has had mild hacky cough for awhile, but cough worsened today.  Also, she had noticed some increased shortness of breath and dyspnea on exertion. Admits to increased swelling in her legs, abdomen, face, and her arms over the past 2-3 weeks.  She denies chest pain, palpitations, PND, or orthopnea.  She has also had reddening of her right leg over the past few days, started on Keflex, but no improvement over a 2-day course. She denies fever, chills, or leg pain.  PAST MEDICAL HISTORY: 1. Hypertension. 2. Osteoporosis. 3. DJD. 4. Barrett esophagus. 5. Glaucoma. 6. Chronic kidney disease. 7. History of anemia. 8. Osteoarthritis. 9. Congestive heart failure.  PAST SURGICAL HISTORY: 1. Appendectomy. 2. Cataract. 3. Total knee replacement. 4. Oophorectomy. 5. Colonoscopy, last colonoscopy was in 2009.  SOCIAL HISTORY:  Lives with her husband.  Does not use tobacco, alcohol, or drugs.  FAMILY HISTORY:  Mother had an MI, death at 73.  Father had an MI, death at 60.  Brother had an MI, death in his 1s.  REVIEW OF SYSTEMS:  Please see HPI for review of systems.  ALLERGIES: 1. MORPHINE causes delirium. 2. CODEINE causes delirium.  MEDICATIONS: 1. Coreg 6.25 mg p.o. b.i.d. 2. Spironolactone 12.5 mg daily. 3. Keflex 250 mg t.i.d. x10 days, currently on day #2. 4. Prilosec 20 mg daily. 5. Calcium plus D 600/200 two tablets p.o. daily. 6.  B complex 2 tablets p.o. daily. 7. Iron sulfate 325 mg p.o. daily. 8. Tylenol 500 mg t.i.d. p.r.n. 9. Lasix 40 mg p.o. daily. 10.Mucinex daily. 11.Claritin daily.  PHYSICAL EXAMINATION:  VITAL SIGNS:  Temperature 98, pulse 104, respiration 18, blood pressure 131/77, and satting at 99% on room air. GENERAL:  In no acute distress. HEENT:  Normocephalic and atraumatic.  Pupils are equal, round, and reactive to light.  Moist mucous membranes. NECK:  Supple without adenopathy or thyromegaly. CARDIOVASCULAR:  Regular rate and rhythm.  No murmur, rub, or gallop. LUNGS:  Decreased lung sounds and crackles in the bases bilaterally.  No wheezes heard. ABDOMEN:  Soft, nontender, and nondistended with positive bowel sounds. EXTREMITIES:  Right lower extremity is erythematous and warm to mid thigh with induration.  Back of calf with a dressing over previous I and D site.  A 2+ edema is present bilaterally.  LABORATORY DATA AND STUDIES:  Sodium 140, potassium 3.9, chloride 108, bicarb 22, creatinine 1.94, BUN of 25, and glucose 107.  WBCs of 14.2, hemoglobin 10.6, hematocrit 32.4, and platelets of 160.  Point-of-care  cardiac enzymes were negative.  BNP was 1206.  Chest x-ray with bilateral basilar infiltrates and effusions with some mild progression on the left.  X-ray of the tibia and fibula showed degenerative changes of the knee and ankle with soft tissue swelling.  No bony abnormalities.  ASSESSMENT AND PLAN:  This is an 77 year old female with history of congestive heart failure who has congestive heart failure exacerbation and pneumonia. 1. Congestive heart failure.  Clinical exam and chest x-ray are     consistent with fluid overload.  BNP is elevated but in looking in     clinical notes, it seems to be chronically elevated.  We will admit     to telemetry bed and we will cycle cardiac enzymes.  We will give     her Lasix 40 mg IV daily for now and titrate as needed depending on      response.  We will risk stratify with fasting lipid panel, TSH, and     hemoglobin A1c.  Repeat chest x-ray and EKG in the morning as well     as echo orders.  Continue home Aldactone and Coreg for now. 2. Pneumonia.  We will treat with Avelox, WBC is elevated, but     afebrile.  We will follow fever curve and white count.  If she     seems to be clinically worsening, we will consider adding on     azithromycin for atypical coverage.  We will also get blood     cultures prior to giving antibiotics. 3. Right lower extremity cellulitis.  Has been on Keflex x2 days.  No     improvement.  We will cover for MRSA with vancomycin for now. 4. Chronic kidney disease.  Creatinine seems to be around baseline.     We will monitor now on Lasix. 5. Osteoporosis.  Continue calcium plus D supplement. 6. History of anemia.  Continue iron and vitamin B supplement. 7. Barrett's/gastroesophageal reflux disease.  Continue PPI. 8. Fluids, electrolytes, and nutrition/gastrointestinal.  Low-sodium     heart healthy diet.  Saline lock IV. 9. Prophylaxis.  Heparin 5000 units t.i.d. and Protonix. 10.Disposition.  Pending further clinical improvement. 11.Code status is full code.    ______________________________ Everrett Coombe, MD   ______________________________ Nestor Ramp, MD    CM/MEDQ  D:  10/22/2010  T:  10/23/2010  Job:  [id number]  Electronically Signed by Everrett Coombe MD on 10/25/2010 02:35:57 PM Electronically Signed by Denny Levy MD on 11/08/2010 02:32:54 PM

## 2010-12-20 NOTE — Assessment & Plan Note (Signed)
[facility]                            [facility] OFFICE NOTE   NAME:[name], [name]                       MRN:          [id number]  DATE:12/25/2006                            DOB:          03-16-1922    An 77 year old female seen today for an annual exam.  She has a history  of hypertension, DJD, osteoporosis and history of Barrett's esophagus.  Has a remote history of colon cancer in 1990.  Additionally, she has a  history of glaucoma and is status post surgery.  Has also had cataract  extraction surgery.  She has had total left knee replacement surgery and  an oophorectomy and appendectomy in the past.   REVIEW OF SYSTEMS:  Negative.  Followed by Dr. Jarold [name].  Did have  upper and lower endoscopy is 2004.   FAMILY HISTORY:  Strongly positive for coronary artery disease.   PHYSICAL EXAMINATION:  An elderly white female, alert, cooperative, no  acute distress.  Blood pressure was 130/76.  Fundi, ears, nose, and throat unremarkable.  Large iridectomy scar on  the right.  ENT negative.  Dentures in place.  NECK:  No bruits or adenopathy.  CHEST:  Clear.  BREAST:  Negative, no masses.  CARDIOVASCULAR EXAM:  Revealed normal heart sounds without murmurs.  ABDOMEN:  Soft and nontender, did have a lower midline scar.  No bruits  appreciated, no organomegaly.  PELVIC EXAMINATION:  Negative.  RECTAL EXAM:  No masses, stool heme negative.  Extremities revealed no edema.  Peripheral pulses were not easily  palpable.  Status post left total knee replacement surgery.  NEURO:  Negative.   IMPRESSION:  1. Osteoporosis.  2. Degenerative joint disease.  3. Hypertension.  4. Barrett's esophagus.  5. History of colon cancer.   DISPOSITION:  Medical regimen unchanged.  Will reassess in 6 months.  Laboratory studies will be checked at that time.    Gordy Savers, MD  Electronically Signed   PFK/MedQ  DD: 12/25/2006  DT: 12/25/2006  Job #: [id number]

## 2010-12-23 NOTE — Discharge Summary (Signed)
[facility]  Patient:    [name], [name]                         MRN: [id number] Adm. Date:  03/28/00 Disc. Date: 04/04/00 Attending:  Rande Brunt. Thomasena Edis, M.D. Dictator:   Bynum Bellows. Idacavage, P.A.C. CC:         Gordy Savers, M.D. [facility]  Graylin Shiver. August Saucer, M.D.   Discharge Summary  DIAGNOSES: 1. Left hip open reduction, internal fixation. 2. Left radial fracture. 3. Anemia. 4. Hypertension. 5. Gastroesophageal reflux disease. 6. Right popliteal vein deep vein thrombosis.  HISTORY OF PRESENT ILLNESS:  The patient is a 77 year old female admitted August 18 after a fall.  She did not have any loss of consciousness.  She did sustain a left femur fracture and a comminuted distal left radial fracture. She underwent removal of hardware from a history of left hip screw and insertion of IM nail to femur, March 25, 2000, as well as cast application to left wrist, March 25, 2000, by Dr. August Saucer.  She was placed on Coumadin for DVT prophylaxis.  She was nonweightbearing left lower extremity, however, may touchdown weightbear for transfers, transfused for a hemoglobulin and hematocrit of 7.8 and 22.5, respectively, two units on August 22.  PAST MEDICAL HISTORY:  Significant for hypertension, GERD, status post left total knee arthroplasty in 1996, status post left hip screw in 1993, glaucoma.   SOCIAL HISTORY:  The patient lives with husband in a one-level house with two steps to enter.  Independent prior to admission.  She does have a niece in the area who works.  HOSPITAL COURSE:  The patient was admitted to acute inpatient rehabilitation on August 22 where she participated in physical and occupational therapies. She was noted to have swelling of her right lower extremity especially at the knee and a venous duplex was obtained which did show a nonocclusive DVT, age undetermined at the popliteal vein on the right side.  Due to this fact it was felt that  the patient should continue on Coumadin for at least three months. A followup hemoglobin and hematocrit on the 23rd that she had improved to 10.0 and 29.7, respectively.  The patient was willing and able to participate in physical therapies, however, she was limited due to her nonweightbearing status and the patient became concerned that her husband may not be able to offer her the physical assistance that she would require, and therefore, short-term nursing home placement was sought and a bed was offered at an extended care center.  DISCHARGE MEDICATIONS:  The patient will be discharged to extended care center on the following medications: 1. Coumadin 2 mg daily for three months adjusted to keep INR between    2 to 3. 2. Trinsicon twice a day. 3. Cardura 4 mg daily. 4. Prilosec 20 mg daily. 5. Percocet one or two q.4h. as needed.  DISCHARGE INSTRUCTIONS:  She is nonweightbearing on her left leg.  She is instructed to keep the wound clean and dry.  Pro times as per medical director, and followup with Dr. August Saucer in two weeks.  LABORATORY DATA: Her INR on August 27 was noted to be 3.6 and Coumadin was held.  Coumadin was given 1 mg on the 27th and 2 mg the 28th.  CBC on the 27th showed a hemoglobin of 10.7, hematocrit of 31.0, white blood count of 9.6 and platelets 499.  CONDITION ON DISCHARGE:  Stable. DD:  04/02/00 TD:  04/03/00 Job: [id number]

## 2010-12-23 NOTE — H&P (Signed)
[place]. [facility]  Patient:    [name], PAYANO                         MRN: [id number] Adm. Date:  03/24/00 Attending:  Cammy Copa, M.D.                         History and Physical  CHIEF COMPLAINT:  Left leg pain.  HISTORY OF PRESENT ILLNESS:  Rozena Fierro is a 77 year old community ambulator who fell down steps today and sustained immediate onset of left leg pain and deformity.  She has been unable to bear weight.  PAST MEDICAL HISTORY:  Hypertension, gastroesophageal reflux disease.  PAST SURGICAL HISTORY:  Left total knee replacement, 1996; left compression hip screw in 1993 for four part intertrochanteric fracture.  CURRENT MEDICATIONS:  Cardura, Fosamax, Prilosec, Vitamin B, and calcium and Vitamin D.  ALLERGIES:  MORPHINE and CODEINE.  PHYSICAL EXAMINATION:  GENERAL:  Patient is alert and oriented x 3.  HEENT:  Normal.  CHEST:  Clear to auscultation.  HEART:  Regular rate and rhythm.  ABDOMEN:  Benign.  NECK:  There is no cervical lymphadenopathy.  EXTREMITIES:  Left femur has deformity plus swelling.  Patient has trace to 1+ palpable DP and PT pulses and dorsiflexion and plantar flexion of the foot and impact sensational and dorsal and plantar aspect of the foot.  Plain x-rays demonstrate fracture of the left femur, distal _________ is transverse and angulated.  IMPRESSION:  Transverse femur fracture, distal to dynamic hip screw plate.  PLAN:  Removal of plate plus antegrade femoral nailing.  Risks and benefits are discussed with the patient and her husband.  Specifically the risks include bleeding, infection, _________ , and deep vein thrombosis, nerve and vessel damage, possibly death.  Patient understands the risks and benefits and will proceed with surgery. DD:  03/24/00 TD:  03/24/00 Job: [id number]

## 2010-12-23 NOTE — Discharge Summary (Signed)
[place]. [facility]  Patient:    [name], [name]                       MRN: [id number] Adm. Date:  [id number] Disc. Date: [id number] Attending:  Herold Harms                           Discharge Summary  DISCHARGE DIAGNOSIS:  Left femur fracture below previously placed plate and                       screw construct for intertrochanteric fracture.  SECONDARY DIAGNOSES: 1. Hypertension. 2. Gastroesophageal reflux disease.  OPERATIONS AND PROCEDURES:  Removal of hardware plus antegrade femoral nailing of left femur fracture performed March 25, 2000.  HISTORY OF PRESENT ILLNESS:  Patient is a 77 year old community ambulator who fell down some stairs on the day of admission.  She complains of left leg pain the next morning.  PAST MEDICAL HISTORY: 1. Hypertension. 2. Gastroesophageal reflux disease.  MEDICATIONS:  Fosamax, Cardura, Naprosyn, Prilosec, vitamin B complex, calcium, and vitamin D.  PAST SURGICAL HISTORY: 1. Left TKA in 1996. 2. Left DHS in 1993 for hip fracture.  PHYSICAL EXAMINATION:  GENERAL:  Patient is alert and oriented x 3.  CHEST:  Clear to auscultation.  CARDIAC:  Heartbeat is regular rate and rhythm.  ABDOMEN:  Benign.  EXTREMITIES:  Left leg has deformity in the thigh.  She has a palpable DP and PT pulse.  Dorsiflexion and plantar flexion intact.  X-RAY FINDINGS:  X-rays demonstrated a transverse femur fracture just distal to the plate.  LABORATORY DATA:  Hematocrit of 31 on day of admission, white count 7.6. Sodium and potassium were 139/3.6, BUN and creatinine 25/1.3.  HOSPITAL COURSE:  The patient was admitted to the orthopedic service March 24, 2000.  On March 25, 2000, the patient underwent left hip hardware removal and antegrade femoral nailing.  Patient tolerated procedure well without immediate complications.  Postop, patient had intact dorsiflexion and plantar flexion of the leg as well as intact  sensation and perfusion.  Patient received two units of packed red blood cells for a hematocrit of 22.  Her hematocrit responded to a level of 28.  Patient was seen by physical therapy and occupational therapy.  They started working with her nonweightbearing on the left lower extremity.  It should also be noted that the patient had a nondisplaced left distal radius fracture which was splinted at the time of her emergency room evaluation.  Patient made good progress with physical therapy. Plain x-rays demonstrate hardware in good position following the procedure. Her hematocrit drifted back down to 24.9 and on March 28, 2000 she received two more units of packed red blood cells.  Coumadin was started for a DVT prophylaxis.  Patients incision was intact at the time of transfer to rehab. She was transferred to rehab on March 28, 2000.  I will follow her up in the hospital.  DISCHARGE MEDICATIONS:  To include admission medications plus Coumadin as well as Percocet one to two p.o. q.3-4h. p.r.n. pain.  FOLLOW-UP:  I will see her as she on the rehab service. DD:  04/30/00 TD:  04/30/00 Job: [id number]

## 2010-12-23 NOTE — Op Note (Signed)
[place]. [facility]  Patient:    [name], [name]                       MRN: [id number] Proc. Date: 03/25/00 Adm. Date:  [id number] Attending:  Burnard Bunting                           Operative Report  PREOPERATIVE DIAGNOSIS:  Left femur fracture below previously-placed PHS plate.  POSTOPERATIVE DIAGNOSIS:  Left femur fracture below previously-placed PHS plate.  OPERATION PERFORMED:  Removal of hardware, plus reaming and intramedullary nailing of left femur fracture.  ANESTHESIA:  General endotracheal.  ESTIMATED BLOOD LOSS: 800 cc.  DRAINS:  Hemovac x 2.  INDICATIONS:  The patient is a 77 year old lady who tripped and fell down her stairs, had immediate onset of pain and deformity.  Plain x-rays demonstrated a transverse fracture distal to the PHS plate.  DESCRIPTION OF PROCEDURE:  The patient was brought to the operating room where general endotracheal anesthesia was induced.  Preoperative IV antibiotics were administered.  The patient was positioned on the fracture table with the peroneal region and right lateral extremity in the lithotomy position with the peroneal nerve well-padded.  Traction was placed on the left leg.  Fluoroscopy demonstrated the transverse femur fracture distal to the plate.  The left leg was then prepped with DuraPrep solution and draped in a sterile manner.  The leg was covered [place].  Prior incision for the plate and open reduction and internal fixation was utilized.  The skin and subcutaneous tissue were sharply divided.  Fascia lata was identified and divided.  The vastus lateralis was lifted and raised.  The fascia lata was lifted and was elevated anteriorly in an inverted-J fashion.  Keena retractors were placed.  The plate was identified.  Six screws were removed.  After using an osteotome to free the circumference of the plate, it was removed from lag screw.  The lag screw was then unscrewed.  After removal of  the hardware, a 3 cm incision was made proximal to the greater trochanter.  A guide pin was placed at the tip of the greater trochanter, into the femoral canal.  Correct placement of the guide pin was confirmed in the AP and lateral planes.  The over-reamer was then used to enter into the canal through the trochanter.  A guide pin was then placed across the fracture site which was reduced under AP and lateral fluoroscopy.  The canal was then reamed to 14 mm for a 13 by 36 mm titanium nail.  Approximately two cortical diameters were maintained between the tip of the nail and the proximal aspect of her total knee.  The nail was then passed across the fracture site.  The integrity of the lateral trochanter was maintained.  Good reduction of the fracture was observed.  After placement of the nail, the fascia lata was inspected and found to have no gap.  The proximal interlock was placed and two distal interlocks were also placed.  The leg was then observed in the AP and lateral planes under fluoroscopy and all hardware appeared intact.  Three incisions were then thoroughly irrigated and the distal incision was closed using 2-0 Vicryl and skin staples.  A drain was placed in each of the two more proximal incisions.  The incision for removal of hardware was closed using 0 Vicryl to reapproximate the fascia lata and deep 0  Vicryl, 2-0 Vicryl and skin staples to reapproximate the skin edges.  0 Vicryl was also used to reapproximate the gluteal fascia from the proximal incision.  Vicryl 2-0 and skin staples were used for closure.  A bulky dressing and compressive wrap around the thigh was placed.  The patient was transferred to the recovery room in stable condition.  The patient had dorsiflexion and plantar flexion of the toes at the conclusion of the case. D:  03/25/00 TD:  03/25/00 Job: [id number]

## 2011-04-04 ENCOUNTER — Inpatient Hospital Stay (HOSPITAL_COMMUNITY)
Admission: EM | Admit: 2011-04-04 | Discharge: 2011-04-11 | DRG: 603 | Disposition: A | Discharge status: Home Health | Payer: Medicare Other | Attending: Internal Medicine | Admitting: Internal Medicine

## 2011-04-04 DIAGNOSIS — I509 Heart failure, unspecified: Secondary | ICD-10-CM | POA: Diagnosis present

## 2011-04-04 DIAGNOSIS — Z96659 Presence of unspecified artificial knee joint: Secondary | ICD-10-CM

## 2011-04-04 DIAGNOSIS — E871 Hypo-osmolality and hyponatremia: Secondary | ICD-10-CM | POA: Diagnosis present

## 2011-04-04 DIAGNOSIS — N179 Acute kidney failure, unspecified: Secondary | ICD-10-CM | POA: Diagnosis present

## 2011-04-04 DIAGNOSIS — I5032 Chronic diastolic (congestive) heart failure: Secondary | ICD-10-CM | POA: Diagnosis present

## 2011-04-04 DIAGNOSIS — I079 Rheumatic tricuspid valve disease, unspecified: Secondary | ICD-10-CM | POA: Diagnosis present

## 2011-04-04 DIAGNOSIS — L02419 Cutaneous abscess of limb, unspecified: Principal | ICD-10-CM | POA: Diagnosis present

## 2011-04-04 DIAGNOSIS — I129 Hypertensive chronic kidney disease with stage 1 through stage 4 chronic kidney disease, or unspecified chronic kidney disease: Secondary | ICD-10-CM | POA: Diagnosis present

## 2011-04-04 DIAGNOSIS — N184 Chronic kidney disease, stage 4 (severe): Secondary | ICD-10-CM | POA: Diagnosis present

## 2011-04-04 LAB — DIFFERENTIAL
Basophils Absolute: 0 10*3/uL (ref 0.0–0.1)
Eosinophils Relative: 2 % (ref 0–5)
Lymphocytes Relative: 10 % — ABNORMAL LOW (ref 12–46)
Monocytes Absolute: 0.8 10*3/uL (ref 0.1–1.0)
Monocytes Relative: 12 % (ref 3–12)

## 2011-04-04 LAB — COMPREHENSIVE METABOLIC PANEL
Albumin: 3.1 g/dL — ABNORMAL LOW (ref 3.5–5.2)
BUN: 59 mg/dL — ABNORMAL HIGH (ref 6–23)
Calcium: 9.2 mg/dL (ref 8.4–10.5)
Creatinine, Ser: 3.03 mg/dL — ABNORMAL HIGH (ref 0.50–1.10)
GFR calc Af Amer: 18 mL/min — ABNORMAL LOW (ref >60)
Glucose, Bld: 143 mg/dL — ABNORMAL HIGH (ref 70–99)
Potassium: 3.6 mEq/L (ref 3.5–5.1)
Total Protein: 6.8 g/dL (ref 6.0–8.3)

## 2011-04-04 LAB — CBC
HCT: 30.1 % — ABNORMAL LOW (ref 36.0–46.0)
Hemoglobin: 10.3 g/dL — ABNORMAL LOW (ref 12.0–15.0)
MCH: 32.1 pg (ref 26.0–34.0)
MCHC: 34.2 g/dL (ref 30.0–36.0)
MCV: 93.8 fL (ref 78.0–100.0)
Platelets: 167 K/uL (ref 150–400)
RBC: 3.21 MIL/uL — ABNORMAL LOW (ref 3.87–5.11)
RDW: 13.2 % (ref 11.5–15.5)
WBC: 7 K/uL (ref 4.0–10.5)

## 2011-04-05 DIAGNOSIS — M7989 Other specified soft tissue disorders: Secondary | ICD-10-CM

## 2011-04-05 DIAGNOSIS — M79609 Pain in unspecified limb: Secondary | ICD-10-CM

## 2011-04-05 LAB — CBC
HCT: 29.3 % — ABNORMAL LOW (ref 36.0–46.0)
Hemoglobin: 9.9 g/dL — ABNORMAL LOW (ref 12.0–15.0)
MCV: 93.6 fL (ref 78.0–100.0)
RBC: 3.13 MIL/uL — ABNORMAL LOW (ref 3.87–5.11)
RDW: 13.3 % (ref 11.5–15.5)
WBC: 6.5 10*3/uL (ref 4.0–10.5)

## 2011-04-05 LAB — BASIC METABOLIC PANEL
BUN: 54 mg/dL — ABNORMAL HIGH (ref 6–23)
CO2: 26 mEq/L (ref 19–32)
Chloride: 95 mEq/L — ABNORMAL LOW (ref 96–112)
Creatinine, Ser: 2.66 mg/dL — ABNORMAL HIGH (ref 0.50–1.10)
GFR calc Af Amer: 21 mL/min — ABNORMAL LOW (ref >60)
Potassium: 3.5 mEq/L (ref 3.5–5.1)

## 2011-04-05 NOTE — H&P (Signed)
[name], [name]                ACCOUNT NO.:  [id number]  MEDICAL RECORD NO.:  [id number]  LOCATION:  WLED                         FACILITY:  [facility]  PHYSICIAN:  Gery Pray, MD      DATE OF BIRTH:  February 01, 1922  DATE OF ADMISSION:  04/04/2011 DATE OF DISCHARGE:                             HISTORY & PHYSICAL   PRIMARY CARE PHYSICIAN:  Dr. Redmond School  CODE STATUS:  Full code.    Team 4.  CHIEF COMPLAINT:  Right lower leg swelling.  HISTORY OF PRESENT ILLNESS:  This is a pleasant 77 year old female who lives at home with her husband.  She states that on Friday, she started getting some swelling and burning on the right lower extremity, it also started oozing.  It has been getting worse.  She reports severe chills, but no fevers.  No nausea.  No vomiting.  No pain in chest or abdomen.  No shortness of breath.  No altered mental status.  She states she is still able to walk; however, she does have some discomfort from the pain.  She probably decided to come to ER today.    She did have a similar episode approximately 2-1/2 months ago.  At that point, she had a little open sore in the back of her leg; however, that has since healed.  She does not have any history of recurrent cellulitis.  She does not have chronic right lower extremity swelling.  History obtained from the patient as well as the sister-in-law, who is at the bedside. They both appear reliable.  PAST MEDICAL HISTORY: 1. Chronic kidney disease stage 4. 2. Hypertension. 3. Arthritis. 4. Chronic anemia. 5. Tricuspid regurgitation, severe. 6. Moderate-to-severe mitral regurgitation. 7. A single episode of  .  PAST SURGICAL HISTORY:  Appendectomy, left oophorectomy, and total knee replacement.  MEDICATIONS:  Omeprazole, B complex, iron sulfate, Lasix, Crestor, baby aspirin, Klor-Con, and Lasix.  ALLERGIES:  The patient is allergic to MORPHINE and CODEINE.  It appears to cause change in mental status.  SOCIAL  HISTORY:  Negative tobacco, alcohol, or illicit drugs.  The patient lives at home with her husband.  She has a cane and a walker that she uses.  Most she has a wheelchair which she seldom uses.  She does not have home oxygen.  FAMILY HISTORY:  Significant for coronary artery disease.  Her mom with MI at age 51.  Her father, age 29 and her brother, age 69.  PHYSICAL EXAMINATION:  VITAL SIGNS:  Blood pressure 126/79, pulse 72, respirations 22, AND temperature 97.2. GENERAL:  Alert and oriented female, pleasant.  No acute distress. EYES:  Pink conjunctiva.  PERRLA. ENT:  Moist oral mucosa.  Trachea midline. NECK:  Supple. LUNGS:  Clear to auscultation, bilateral use of accessory muscles. CARDIOVASCULAR:  Regular rate and rhythm without murmurs, rigors, or gallops.  No JVD.  No carotid bruits. ABDOMEN:  Soft, positive bowel sounds, nontender, and nondistended.  No organomegaly. NEUROLOGIC:  Cranial nerves, II through XII grossly intact.  Sensation intact. MUSCULOSKELETAL:  Strength is 5/5 in all extremities, globally weakened. The patient does have edema on the right lower extremity. SKIN:  The patient has cellulitis in  the right lower extremity, almost up to the knee.  No rashes or subcutaneous crepitation.  LABS:  Sodium 131, potassium 3.6, chloride 92, CO2 of 25, glucose 143, BUN 59, creatinine 3, total protein 6.8, AST is slightly elevated to 41, ALT is slightly elevated to 43, otherwise normal.  White blood count 7.2, hemoglobin 10.3, and platelets 167.  The patient's baseline creatinine is 2.2.  ASSESSMENT AND PLAN: 1. Cellulitis, right lower extremity.  The patient will be admitted.     Blood cultures will be ordered, especially if the patient is having     chills.  We will order vancomycin and Rocephin, pharmacy to dose;     Tylenol and Lortab p.r.n. pain. [place] r/o DVT. 2. Acute on chronic kidney injury.  We will give the patient some     gentle intravenous fluid hydration,  monitor for congestive heart     failure, especially with her history of congestive heart     failure/regurgitation.  We will only give 500 cc of normal saline.     For now, we will also hold the patient's Lasix and potassium.          ______________________________ Gery Pray, MD     DC/MEDQ  D:  04/04/2011  T:  04/05/2011  Job:  [id number]  Electronically Signed by Gery Pray MD on 04/05/2011 03:22:55 AM

## 2011-04-06 LAB — CBC
HCT: 27.4 % — ABNORMAL LOW (ref 36.0–46.0)
Hemoglobin: 9.2 g/dL — ABNORMAL LOW (ref 12.0–15.0)
MCHC: 33.6 g/dL (ref 30.0–36.0)
MCV: 94.8 fL (ref 78.0–100.0)
RDW: 13.3 % (ref 11.5–15.5)

## 2011-04-06 LAB — BASIC METABOLIC PANEL
BUN: 52 mg/dL — ABNORMAL HIGH (ref 6–23)
Chloride: 99 mEq/L (ref 96–112)
Creatinine, Ser: 2.38 mg/dL — ABNORMAL HIGH (ref 0.50–1.10)
Glucose, Bld: 93 mg/dL (ref 70–99)
Potassium: 3.4 mEq/L — ABNORMAL LOW (ref 3.5–5.1)

## 2011-04-07 LAB — CBC
MCH: 32.2 pg (ref 26.0–34.0)
MCHC: 33.9 g/dL (ref 30.0–36.0)
Platelets: 188 10*3/uL (ref 150–400)
RBC: 2.86 MIL/uL — ABNORMAL LOW (ref 3.87–5.11)

## 2011-04-07 LAB — BASIC METABOLIC PANEL
Calcium: 8.6 mg/dL (ref 8.4–10.5)
GFR calc non Af Amer: 22 mL/min — ABNORMAL LOW (ref >60)
Sodium: 136 mEq/L (ref 135–145)

## 2011-04-08 LAB — BASIC METABOLIC PANEL
GFR calc Af Amer: 27 mL/min — ABNORMAL LOW (ref >60)
GFR calc non Af Amer: 22 mL/min — ABNORMAL LOW (ref >60)
Potassium: 3.8 mEq/L (ref 3.5–5.1)
Sodium: 136 mEq/L (ref 135–145)

## 2011-04-09 LAB — BASIC METABOLIC PANEL
CO2: 25 mEq/L (ref 19–32)
GFR calc non Af Amer: 20 mL/min — ABNORMAL LOW (ref >60)
Glucose, Bld: 96 mg/dL (ref 70–99)
Potassium: 4.2 mEq/L (ref 3.5–5.1)
Sodium: 136 mEq/L (ref 135–145)

## 2011-04-09 LAB — CBC
Hemoglobin: 9.3 g/dL — ABNORMAL LOW (ref 12.0–15.0)
RBC: 2.91 MIL/uL — ABNORMAL LOW (ref 3.87–5.11)

## 2011-04-11 LAB — CULTURE, BLOOD (ROUTINE X 2)
Culture  Setup Time: 201208290848
Culture: NO GROWTH

## 2011-04-15 NOTE — Discharge Summary (Signed)
NAMEJANNETH, [name]                ACCOUNT NO.:  [id number]  MEDICAL RECORD NO.:  [id number]  LOCATION:  1333                         FACILITY:  [facility]  PHYSICIAN:  Hartley Barefoot, MD    DATE OF BIRTH:  1921-12-01  DATE OF ADMISSION:  04/04/2011 DATE OF DISCHARGE:  04/11/2011                              DISCHARGE SUMMARY   DISCHARGE DIAGNOSES: 1. Right lower extremity cellulitis. 2. Chronic kidney disease, acute on chronic stage 4. 3. Hypertension. 4. Arthritis. 5. Chronic anemia. 6. Tricuspid valve regurgitation. 7. Diastolic heart failure compensated.  DISCHARGE MEDICATIONS: 1. Ciprofloxacin 500 mg p.o. every 24 hours. 2. Doxycycline 100 mg b.i.d. 3. Hydrocodone 5/325 1 tablet every 6 hours as needed. 4. Mupirocin one application nicely twice daily. 5. Lasix 80 mg half a tablet daily. 6. Calcium 1 tablet daily. 7. Aspirin 81 mg p.o. daily. 8. Crestor 5 mg daily at bedtime. 9. Ferrous sulfate 325 p.o. daily. 10.Potassium chloride 20 mEq p.o. daily. 11.Prilosec 20 mg p.o. daily. 12.Tylenol 500 1 tablet by mouth twice daily. 13.Vitamin B complex 2 tablets by mouth daily.  FOLLOWUP:  She will need to follow with her primary care physician, adjust Lasix dose as needed.  She will need a BMET to follow renal function.  She will need also monitor her right lower extremity cellulitis.  DISPOSITION:  The patient will be discharged home with home health nurse, PT, OT nurses.  I discussed with [name] that is helping the POA. They do not want a placement at this time.  They does not want assisted living facility at this time, but they are considering assisted living facility in the future.  They are working on that.  I will let them speak with the social worker for future reference.  [name] said somebody is going to go to the house and help with meals assistance.  We are going to send all the support required to help [name] go through these right lower extremity  cellulitis.  HISTORY OF PRESENT ILLNESS:  This is a very pleasant 77 year old who lives at home with husband.  She started to have some pain on her right lower extremity and burning and started oozing.  Please for further details of HPI, refer to HPI done on April 05, 2011.  HOSPITAL COURSE: 1. Right lower extremity cellulitis.  The patient with a slow     recovery.  She has right lower extremity erythema and oozing on her     legs that was getting better over the course of hospitalization.     She was treated with vancomycin and IV renal dose and with     ciprofloxacin.  She was then transitioned to doxycycline.  She was     getting wound care moisturizer cream to the skin daily.  Her right     lower extremity cellulitis has improved.  She will need to follow     with her primary care physician to make sure that this is resolved. 2. Acute on chronic renal failure.  The patient presented with some     worsening of her kidney function.  On admission, her creatinine was     at 3.0.  Her  Lasix was on hold.  I am going to restart her Lasix     today.  Her creatinine is now to her baseline at 2.3.  She will     need a BMET to follow up renal function.  Her Lasix dose was     decreased.  No evidence of pulmonary edema or heart failure.     Hartley Barefoot, MD     BR/MEDQ  D:  04/11/2011  T:  04/11/2011  Job:  [id number]  cc:   Florentina Jenny, MD Fax: [phone]  Electronically Signed by Hartley Barefoot MD on 04/15/2011 01:12:20 PM

## 2011-05-09 LAB — DIFFERENTIAL
Eosinophils Absolute: 0
Eosinophils Relative: 1
Lymphs Abs: 1
Monocytes Absolute: 0.5
Monocytes Relative: 11

## 2011-05-09 LAB — PROTIME-INR: Prothrombin Time: 14.1

## 2011-05-09 LAB — CBC
HCT: 27.8 — ABNORMAL LOW
Hemoglobin: 9.3 — ABNORMAL LOW
MCV: 90.3
Platelets: 285
WBC: 5

## 2011-05-09 LAB — POCT CARDIAC MARKERS
CKMB, poc: 18.8
Myoglobin, poc: >500
Troponin i, poc: <0.05

## 2011-05-09 LAB — URINALYSIS, ROUTINE W REFLEX MICROSCOPIC
Glucose, UA: NEGATIVE
Leukocytes, UA: NEGATIVE
Specific Gravity, Urine: 1.006
Urobilinogen, UA: 0.2

## 2011-05-09 LAB — URINE MICROSCOPIC-ADD ON

## 2011-05-09 LAB — URINE CULTURE

## 2011-05-09 LAB — POCT I-STAT, CHEM 8
Chloride: 105
HCT: 29 — ABNORMAL LOW
Hemoglobin: 9.9 — ABNORMAL LOW
Potassium: 4.3
Sodium: 137

## 2011-07-08 ENCOUNTER — Encounter: Payer: Self-pay | Admitting: *Deleted

## 2011-07-08 ENCOUNTER — Inpatient Hospital Stay (HOSPITAL_COMMUNITY)
Admission: EM | Admit: 2011-07-08 | Discharge: 2011-07-12 | DRG: 603 | Disposition: A | Discharge status: Skilled Nursing Facility | Payer: Medicare Other | Attending: Internal Medicine | Admitting: Internal Medicine

## 2011-07-08 DIAGNOSIS — K222 Esophageal obstruction: Secondary | ICD-10-CM

## 2011-07-08 DIAGNOSIS — I10 Essential (primary) hypertension: Secondary | ICD-10-CM | POA: Diagnosis present

## 2011-07-08 DIAGNOSIS — I5032 Chronic diastolic (congestive) heart failure: Secondary | ICD-10-CM | POA: Diagnosis present

## 2011-07-08 DIAGNOSIS — L03116 Cellulitis of left lower limb: Secondary | ICD-10-CM

## 2011-07-08 DIAGNOSIS — D638 Anemia in other chronic diseases classified elsewhere: Secondary | ICD-10-CM

## 2011-07-08 DIAGNOSIS — H409 Unspecified glaucoma: Secondary | ICD-10-CM

## 2011-07-08 DIAGNOSIS — K219 Gastro-esophageal reflux disease without esophagitis: Secondary | ICD-10-CM | POA: Diagnosis present

## 2011-07-08 DIAGNOSIS — R197 Diarrhea, unspecified: Secondary | ICD-10-CM

## 2011-07-08 DIAGNOSIS — D509 Iron deficiency anemia, unspecified: Secondary | ICD-10-CM

## 2011-07-08 DIAGNOSIS — L02419 Cutaneous abscess of limb, unspecified: Principal | ICD-10-CM | POA: Diagnosis present

## 2011-07-08 DIAGNOSIS — R509 Fever, unspecified: Secondary | ICD-10-CM | POA: Diagnosis present

## 2011-07-08 DIAGNOSIS — I517 Cardiomegaly: Secondary | ICD-10-CM

## 2011-07-08 DIAGNOSIS — R609 Edema, unspecified: Secondary | ICD-10-CM

## 2011-07-08 DIAGNOSIS — N183 Chronic kidney disease, stage 3 unspecified: Secondary | ICD-10-CM | POA: Diagnosis present

## 2011-07-08 DIAGNOSIS — Z86010 Personal history of colonic polyps: Secondary | ICD-10-CM

## 2011-07-08 DIAGNOSIS — K449 Diaphragmatic hernia without obstruction or gangrene: Secondary | ICD-10-CM

## 2011-07-08 DIAGNOSIS — I129 Hypertensive chronic kidney disease with stage 1 through stage 4 chronic kidney disease, or unspecified chronic kidney disease: Secondary | ICD-10-CM | POA: Diagnosis present

## 2011-07-08 DIAGNOSIS — K5730 Diverticulosis of large intestine without perforation or abscess without bleeding: Secondary | ICD-10-CM

## 2011-07-08 DIAGNOSIS — E876 Hypokalemia: Secondary | ICD-10-CM | POA: Diagnosis present

## 2011-07-08 DIAGNOSIS — L0390 Cellulitis, unspecified: Secondary | ICD-10-CM

## 2011-07-08 DIAGNOSIS — M810 Age-related osteoporosis without current pathological fracture: Secondary | ICD-10-CM

## 2011-07-08 DIAGNOSIS — M1990 Unspecified osteoarthritis, unspecified site: Secondary | ICD-10-CM

## 2011-07-08 DIAGNOSIS — I071 Rheumatic tricuspid insufficiency: Secondary | ICD-10-CM | POA: Insufficient documentation

## 2011-07-08 DIAGNOSIS — I509 Heart failure, unspecified: Secondary | ICD-10-CM | POA: Diagnosis present

## 2011-07-08 DIAGNOSIS — I340 Nonrheumatic mitral (valve) insufficiency: Secondary | ICD-10-CM

## 2011-07-08 DIAGNOSIS — K2270 Barrett's esophagus without dysplasia: Secondary | ICD-10-CM

## 2011-07-08 DIAGNOSIS — K625 Hemorrhage of anus and rectum: Secondary | ICD-10-CM

## 2011-07-08 DIAGNOSIS — I5020 Unspecified systolic (congestive) heart failure: Secondary | ICD-10-CM

## 2011-07-08 HISTORY — DX: Heart failure, unspecified: I50.9

## 2011-07-08 HISTORY — DX: Unspecified osteoarthritis, unspecified site: M19.90

## 2011-07-08 HISTORY — DX: Chronic kidney disease, stage 3 (moderate): N18.3

## 2011-07-08 HISTORY — DX: Essential (primary) hypertension: I10

## 2011-07-08 HISTORY — DX: Chronic kidney disease, stage 3 unspecified: N18.30

## 2011-07-08 HISTORY — DX: Anemia, unspecified: D64.9

## 2011-07-08 HISTORY — DX: Cellulitis, unspecified: L03.90

## 2011-07-08 LAB — BASIC METABOLIC PANEL
BUN: 53 mg/dL — ABNORMAL HIGH (ref 6–23)
CO2: 26 mEq/L (ref 19–32)
Chloride: 99 mEq/L (ref 96–112)
Creatinine, Ser: 2.48 mg/dL — ABNORMAL HIGH (ref 0.50–1.10)
GFR calc Af Amer: 19 mL/min — ABNORMAL LOW (ref >90)
Potassium: 3.4 mEq/L — ABNORMAL LOW (ref 3.5–5.1)

## 2011-07-08 LAB — CBC
HCT: 31.6 % — ABNORMAL LOW (ref 36.0–46.0)
Hemoglobin: 10.3 g/dL — ABNORMAL LOW (ref 12.0–15.0)
WBC: 8.1 10*3/uL (ref 4.0–10.5)

## 2011-07-08 LAB — DIFFERENTIAL
Basophils Absolute: 0 10*3/uL (ref 0.0–0.1)
Basophils Relative: 0 % (ref 0–1)
Lymphocytes Relative: 10 % — ABNORMAL LOW (ref 12–46)
Monocytes Absolute: 1 10*3/uL (ref 0.1–1.0)
Monocytes Relative: 13 % — ABNORMAL HIGH (ref 3–12)
Neutro Abs: 6.1 10*3/uL (ref 1.7–7.7)
Neutrophils Relative %: 75 % (ref 43–77)

## 2011-07-08 MED ORDER — ACETAMINOPHEN 500 MG PO TABS
500.0000 mg | ORAL_TABLET | Freq: Two times a day (BID) | ORAL | Status: DC
  Administered 2011-07-08 – 2011-07-12 (×8): 500 mg via ORAL
  Filled 2011-07-08 (×10): qty 1

## 2011-07-08 MED ORDER — SODIUM CHLORIDE 0.9 % IV SOLN: 250.0000 mL | INTRAVENOUS | Status: DC | PRN

## 2011-07-08 MED ORDER — B COMPLEX-C PO TABS
2.0000 | ORAL_TABLET | Freq: Every day | ORAL | Status: DC
  Administered 2011-07-08 – 2011-07-12 (×5): 2 via ORAL
  Filled 2011-07-08 (×5): qty 2

## 2011-07-08 MED ORDER — VANCOMYCIN HCL IN DEXTROSE 1-5 GM/200ML-% IV SOLN
1000.0000 mg | Freq: Once | INTRAVENOUS | Status: AC
  Administered 2011-07-08: 1000 mg via INTRAVENOUS
  Filled 2011-07-08: qty 200

## 2011-07-08 MED ORDER — B COMPLEX VITAMINS PO CAPS: 2.0000 | ORAL_CAPSULE | Freq: Every day | ORAL | Status: DC

## 2011-07-08 MED ORDER — ASPIRIN EC 81 MG PO TBEC
81.0000 mg | DELAYED_RELEASE_TABLET | Freq: Every day | ORAL | Status: DC
  Administered 2011-07-08 – 2011-07-12 (×5): 81 mg via ORAL
  Filled 2011-07-08 (×5): qty 1

## 2011-07-08 MED ORDER — POTASSIUM CHLORIDE CRYS ER 20 MEQ PO TBCR
20.0000 meq | EXTENDED_RELEASE_TABLET | Freq: Every day | ORAL | Status: DC
  Administered 2011-07-09 – 2011-07-12 (×4): 20 meq via ORAL
  Filled 2011-07-08 (×5): qty 1

## 2011-07-08 MED ORDER — FUROSEMIDE 80 MG PO TABS
80.0000 mg | ORAL_TABLET | Freq: Two times a day (BID) | ORAL | Status: DC
  Administered 2011-07-09 – 2011-07-12 (×6): 80 mg via ORAL
  Filled 2011-07-08 (×7): qty 1

## 2011-07-08 MED ORDER — OXYCODONE HCL 5 MG PO TABS
5.0000 mg | ORAL_TABLET | ORAL | Status: DC | PRN
  Administered 2011-07-09 – 2011-07-11 (×3): 5 mg via ORAL
  Filled 2011-07-08 (×3): qty 1

## 2011-07-08 MED ORDER — FERROUS SULFATE 325 (65 FE) MG PO TABS
325.0000 mg | ORAL_TABLET | Freq: Every day | ORAL | Status: DC
  Administered 2011-07-09 – 2011-07-12 (×4): 325 mg via ORAL
  Filled 2011-07-08 (×4): qty 1

## 2011-07-08 MED ORDER — ROSUVASTATIN CALCIUM 5 MG PO TABS
5.0000 mg | ORAL_TABLET | Freq: Every evening | ORAL | Status: DC
  Administered 2011-07-08 – 2011-07-11 (×4): 5 mg via ORAL
  Filled 2011-07-08 (×5): qty 1

## 2011-07-08 MED ORDER — PANTOPRAZOLE SODIUM 40 MG PO TBEC
40.0000 mg | DELAYED_RELEASE_TABLET | Freq: Every day | ORAL | Status: DC
  Administered 2011-07-08 – 2011-07-12 (×5): 40 mg via ORAL
  Filled 2011-07-08 (×5): qty 1

## 2011-07-08 MED ORDER — POTASSIUM CHLORIDE CRYS ER 20 MEQ PO TBCR
40.0000 meq | EXTENDED_RELEASE_TABLET | Freq: Once | ORAL | Status: AC
  Administered 2011-07-08: 40 meq via ORAL
  Filled 2011-07-08: qty 2

## 2011-07-08 MED ORDER — SODIUM CHLORIDE 0.9 % IJ SOLN: 3.0000 mL | INTRAMUSCULAR | Status: DC | PRN

## 2011-07-08 MED ORDER — HYDROMORPHONE HCL PF 1 MG/ML IJ SOLN: 0.5000 mg | INTRAMUSCULAR | Status: DC | PRN

## 2011-07-08 MED ORDER — ENOXAPARIN SODIUM 30 MG/0.3ML ~~LOC~~ SOLN
30.0000 mg | SUBCUTANEOUS | Status: DC
  Administered 2011-07-08 – 2011-07-11 (×4): 30 mg via SUBCUTANEOUS
  Filled 2011-07-08 (×5): qty 0.3

## 2011-07-08 NOTE — ED Provider Notes (Signed)
History     CSN: [id number] Arrival date & time: 07/08/2011 12:19 PM   First MD Initiated Contact with Patient 07/08/11 1429      Chief Complaint  Patient presents with  . Extremity Pain    (Consider location/radiation/quality/duration/timing/severity/associated sxs/prior treatment) HPI... level V caveat for slight dementia.  Complains of erythema and her left lower extremity for approximately 2 days. No fever or chills. Lives at home with help. Eating well. She has had cellulitis in the past. She is not diabetic.  No known traumatic event to extremity.  Palpation makes it worse.  Past Medical History  Diagnosis Date  . CHF (congestive heart failure)   . Hypertension   . Cellulitis   . Arthritis     Past Surgical History  Procedure Date  . Knee surgery   . Elbow surgery     No family history on file.  History  Substance Use Topics  . Smoking status: Never Smoker   . Smokeless tobacco: Not on file  . Alcohol Use: No    OB History    Grav Para Term Preterm Abortions TAB SAB Ect Mult Living                  Review of Systems  Unable to perform ROS: Other    Allergies  Codeine phosphate and Morphine sulfate  Home Medications   Current Outpatient Rx  Name Route Sig Dispense Refill  . ACETAMINOPHEN 500 MG PO TABS Oral Take 500 mg by mouth 2 (two) times daily.      . AMOXICILLIN-POT CLAVULANATE 875-125 MG PO TABS Oral Take 1 tablet by mouth 2 (two) times daily.      . ASPIRIN 81 MG PO TABS Oral Take 81 mg by mouth daily.      . B COMPLEX VITAMINS PO CAPS Oral Take 2 capsules by mouth daily.      Marland Kitchen CALCIUM CARBONATE 600 MG PO TABS Oral Take 600 mg by mouth daily.      Marland Kitchen FERROUS SULFATE 325 (65 FE) MG PO TABS Oral Take 325 mg by mouth daily with breakfast.      . FUROSEMIDE 80 MG PO TABS Oral Take 80 mg by mouth 2 (two) times daily.      Marland Kitchen OMEPRAZOLE 20 MG PO CPDR Oral Take 20 mg by mouth daily.      Marland Kitchen POTASSIUM CHLORIDE CRYS CR 20 MEQ PO TBCR Oral Take 20 mEq  by mouth daily.      Marland Kitchen ROSUVASTATIN CALCIUM 5 MG PO TABS Oral Take 5 mg by mouth every evening.        BP 130/74  Pulse 89  Temp(Src) 98 F (36.7 C) (Oral)  Resp 18  SpO2 97%  Physical Exam  Nursing note and vitals reviewed. Constitutional: She is oriented to person, place, and time. She appears well-developed and well-nourished.  HENT:  Head: Normocephalic and atraumatic.  Eyes: Conjunctivae and EOM are normal. Pupils are equal, round, and reactive to light.  Neck: Normal range of motion. Neck supple.  Cardiovascular: Normal rate and regular rhythm.   Pulmonary/Chest: Effort normal and breath sounds normal.  Abdominal: Soft. Bowel sounds are normal.  Musculoskeletal: Normal range of motion.  Neurological: She is alert and oriented to person, place, and time.  Skin:       Left Lower extremity:  Erythema from knee to foot around entire limb. Neurovascular intact. Minimal edema  Psychiatric: She has a normal mood and affect.    ED  Course  Procedures (including critical care time)   Labs Reviewed  CBC  DIFFERENTIAL  BASIC METABOLIC PANEL   No results found.   No diagnosis found.    MDM  History and physical most consistent with cellulitis. Will start IV vancomycin, screening labs, admission        Donnetta Hutching, MD 07/08/11 1553

## 2011-07-08 NOTE — Progress Notes (Signed)
Hospital Admission Note Date: 07/08/2011  Patient name: [name]           Medical record number: [id number] Date of birth: 1922-06-27           Age: 77 y.o.   Gender: female    PCP: Florentina Jenny  Chief Complaint:  Left leg redness and swelling in  HPI: [name] is a 77 y.o. female was past medical history of chronic diastolic CHF and mitral regurgitation. Patient also have a stage III chronic kidney disease. Patient started to have a left lower extremity redness and swelling about 2 days ago. She reported low-grade fever and feeling chilly. Then her leg started to hurt. She contacted her primary care physician. Patient was started on Augmentin yesterday but the redness, swelling and the pain is worsening so patient decided to come to the hospital for further evaluation.  Past Medical History: Past Medical History  Diagnosis Date  . CHF (congestive heart failure)   . Hypertension   . Cellulitis   . Arthritis   . Chronic kidney disease, stage III (moderate)   . Chronic anemia    Past Surgical History  Procedure Date  . Knee surgery   . Elbow surgery     Medications: Prior to Admission medications   Medication Sig Start Date End Date Taking? Authorizing Provider  acetaminophen (TYLENOL) 500 MG tablet Take 500 mg by mouth 2 (two) times daily.     Yes Historical Provider, MD  amoxicillin-clavulanate (AUGMENTIN) 875-125 MG per tablet Take 1 tablet by mouth 2 (two) times daily.   07/07/11 07/14/11 Yes Historical Provider, MD  aspirin 81 MG tablet Take 81 mg by mouth daily.     Yes Historical Provider, MD  b complex vitamins capsule Take 2 capsules by mouth daily.     Yes Historical Provider, MD  calcium carbonate (OS-CAL) 600 MG TABS Take 600 mg by mouth daily.     Yes Historical Provider, MD  ferrous sulfate 325 (65 FE) MG tablet Take 325 mg by mouth daily with breakfast.     Yes Historical Provider, MD  furosemide (LASIX) 80 MG tablet Take 80 mg by mouth 2 (two) times  daily.     Yes Historical Provider, MD  omeprazole (PRILOSEC) 20 MG capsule Take 20 mg by mouth daily.     Yes Historical Provider, MD  potassium chloride SA (K-DUR,KLOR-CON) 20 MEQ tablet Take 20 mEq by mouth daily.     Yes Historical Provider, MD  rosuvastatin (CRESTOR) 5 MG tablet Take 5 mg by mouth every evening.     Yes Historical Provider, MD    Allergies:   Allergies  Allergen Reactions  . Codeine Phosphate     REACTION: confusion  . Morphine Sulfate     REACTION: confusion    Social History:  reports that she has never smoked. She does not have any smokeless tobacco history on file. She reports that she does not drink alcohol or use illicit drugs.  Family History: No family history on file.  Review of Systems:  Constitutional: negative for anorexia, fevers and sweats Eyes: negative for irritation, redness and visual disturbance Ears, nose, mouth, throat, and face: negative for earaches, epistaxis, nasal congestion and sore throat Respiratory: negative for cough, dyspnea on exertion, sputum and wheezing Cardiovascular: negative for chest pain, dyspnea, lower extremity edema, orthopnea, palpitations and syncope Gastrointestinal: negative for abdominal pain, constipation, diarrhea, melena, nausea and vomiting Genitourinary:negative for dysuria, frequency and hematuria Hematologic/lymphatic: negative for bleeding,  easy bruising and lymphadenopathy Musculoskeletal:negative for arthralgias, muscle weakness and stiff joints Neurological: negative for coordination problems, gait problems, headaches and weakness Endocrine: negative for diabetic symptoms including polydipsia, polyuria and weight loss Allergic/Immunologic: negative for anaphylaxis, hay fever and urticaria  Physical Exam: BP 125/73  Pulse 70  Temp(Src) 97.8 F (36.6 C) (Oral)  Resp 18  SpO2 100% General appearance: alert, cooperative and no distress  Head: Normocephalic, without obvious abnormality, atraumatic   Eyes: conjunctivae/corneas clear. PERRL, EOM's intact. Fundi benign.  Nose: Nares normal. Septum midline. Mucosa normal. No drainage or sinus tenderness.  Throat: lips, mucosa, and tongue normal; teeth and gums normal  Neck: Supple, no masses, no cervical lymphadenopathy, no JVD appreciated, no meningeal signs Resp: clear to auscultation bilaterally  Chest wall: no tenderness  Cardio: regular rate and rhythm, S1, S2 normal, no murmur, click, rub or gallop  GI: soft, non-tender; bowel sounds normal; no masses, no organomegaly  Extremities:  left lower extremity redness, swelling and some blisters weeping serous fluids. The redness is up to the knee.  Skin: Skin color, texture, turgor normal. No rashes or lesions  Neurologic: Alert and oriented X 3, normal strength and tone. Normal symmetric reflexes. Normal coordination and gait  Labs on Admission:   [facility] 07/08/11 1610  NA 136  K 3.4*  CL 99  CO2 26  GLUCOSE 115*  BUN 53*  CREATININE 2.48*  CALCIUM 9.4  MG --  PHOS --   Basename 07/08/11 1610  WBC 8.1  NEUTROABS 6.1  HGB 10.3*  HCT 31.6*  MCV 95.2  PLT 151   Radiological Exams on Admission: No results found.   IMPRESSION: Present on Admission:  .Cellulitis of left leg .Fever .HYPERTENSION .GERD .Chronic diastolic congestive heart failure .Mitral regurgitation  Assessment/Plan  1. Cellulitis, left lower extremity: Patient will be admitted to the hospital for med surge bed after she failed outpatient treatment. Patient will be started on vancomycin IV for the cellulitis. The antibiotic can be changed according to the patient progress.   2. Fever and chills: This is secondary to #1.  3. Chronic diastolic congestive heart failure: Patient Lasix will be continued. There is no other symptoms or signs of decompensation now.  4. Stage 3-4 chronic kidney disease: Baseline creatinine ranges from 2-2.5. Patient stable will restart her Lasix.  Barkley Kratochvil  A 07/08/2011, 7:08 PM

## 2011-07-08 NOTE — ED Notes (Addendum)
Left leg pain for 2 days, with redness from knee down, family reports pt has had cellulitis for about a yr

## 2011-07-09 MED ORDER — POTASSIUM CHLORIDE CRYS ER 20 MEQ PO TBCR
20.0000 meq | EXTENDED_RELEASE_TABLET | Freq: Once | ORAL | Status: AC
  Administered 2011-07-09: 20 meq via ORAL
  Filled 2011-07-09: qty 1

## 2011-07-09 MED ORDER — VANCOMYCIN HCL 500 MG IV SOLR
500.0000 mg | INTRAVENOUS | Status: DC
Start: 2011-07-10 — End: 2011-07-12
  Administered 2011-07-10: 500 mg via INTRAVENOUS
  Filled 2011-07-09 (×2): qty 500

## 2011-07-09 MED ORDER — LIP MEDEX EX OINT
TOPICAL_OINTMENT | CUTANEOUS | Status: AC
  Administered 2011-07-09: 15:00:00
  Filled 2011-07-09: qty 7

## 2011-07-09 NOTE — Progress Notes (Signed)
ANTIBIOTIC CONSULT NOTE - INITIAL  Pharmacy Consult for Vancomycin Indication: Cellulitis  Allergies  Allergen Reactions  . Codeine Phosphate     REACTION: confusion  . Morphine Sulfate     REACTION: confusion    Patient Measurements: Height: 5\' 4"  (162.6 cm) Weight: 119 lb 9.6 oz (54.25 kg) IBW/kg (Calculated) : 54.7  Adjusted Body Weight:   Vital Signs: Temp: 98.1 F (36.7 C) (12/01 2055) Temp src: Oral (12/01 2055) BP: 111/66 mmHg (12/01 2055) Pulse Rate: 74  (12/01 2055) Intake/Output from previous day: 12/01 0701 - 12/02 0700 In: 120 [P.O.:120] Out: 500 [Urine:500] Intake/Output from this shift: Total I/O In: 120 [P.O.:120] Out: 500 [Urine:500]  Labs:  [facility] 07/08/11 1610  WBC 8.1  HGB 10.3*  PLT 151  LABCREA --  CREATININE 2.48*   Estimated Creatinine Clearance: 13.7 ml/min (by C-G formula based on Cr of 2.48). No results found for this basename: VANCOTROUGH:2,VANCOPEAK:2,VANCORANDOM:2,GENTTROUGH:2,GENTPEAK:2,GENTRANDOM:2,TOBRATROUGH:2,TOBRAPEAK:2,TOBRARND:2,AMIKACINPEAK:2,AMIKACINTROU:2,AMIKACIN:2, in the last 72 hours   Microbiology: No results found for this or any previous visit (from the past 720 hour(s)).  Medical History: Past Medical History  Diagnosis Date  . CHF (congestive heart failure)   . Hypertension   . Cellulitis   . Arthritis   . Chronic kidney disease, stage III (moderate)   . Chronic anemia     Medications:  Anti-infectives     Start     Dose/Rate Route Frequency Ordered Stop   07/10/11 1600   vancomycin (VANCOCIN) 500 mg in sodium chloride 0.9 % 100 mL IVPB        500 mg 100 mL/hr over 60 Minutes Intravenous Every 48 hours 07/09/11 0125     07/08/11 1630   vancomycin (VANCOCIN) IVPB 1000 mg/200 mL premix        1,000 mg 200 mL/hr over 60 Minutes Intravenous  Once 07/08/11 1541 07/08/11 1732         Assessment: Patient with cellulitis and First dose of antibiotics already given.  Patient also has poor renal  function.   Goal of Therapy:  Vancomycin trough level 10-15 mcg/ml  Plan:  Measure antibiotic drug levels at steady state Follow up culture results vancomycin 500mg  iv q48hr  Darlina Guys, Jacquenette Shone Crowford 07/09/2011,1:32 AM

## 2011-07-09 NOTE — Progress Notes (Signed)
Patient ID: [name], female   DOB: 05-14-1922, 77 y.o.   MRN: [id number] Subjective: No events overnight. Patient denies chest pain, shortness of breath, abdominal pain.   Objective:  Vital signs in last 24 hours:  Filed Vitals:   07/08/11 1858 07/08/11 2055 07/08/11 2308 07/09/11 0550  BP: 125/73 111/66  99/58  Pulse:  74  73  Temp:  98.1 F (36.7 C)  97.8 F (36.6 C)  TempSrc:  Oral  Oral  Resp:  18  16  Height:   5\' 4"  (1.626 m)   Weight:   54.25 kg (119 lb 9.6 oz)   SpO2: 100% 97%  96%    Intake/Output from previous day:   Intake/Output Summary (Last 24 hours) at 07/09/11 1041 Last data filed at 07/09/11 0052  Gross per 24 hour  Intake    120 ml  Output    500 ml  Net   -380 ml    Physical Exam: General: Alert, awake, oriented x3, in no acute distress. HEENT: No bruits, no goiter. Moist mucous membranes, no scleral icterus, no conjunctival pallor. Heart: Regular rate and rhythm, without murmurs, rubs, gallops. Lungs: Clear to auscultation bilaterally. No wheezing, no rhonchi, no rales.  Abdomen: Soft, nontender, nondistended, positive bowel sounds. Extremities: redness and swelling of left lower extremity Neuro: Grossly intact, nonfocal.    Lab Results:  Basic Metabolic Panel:    Component Value Date/Time   NA 136 07/08/2011 1610   K 3.4* 07/08/2011 1610   CL 99 07/08/2011 1610   CO2 26 07/08/2011 1610   BUN 53* 07/08/2011 1610   CREATININE 2.48* 07/08/2011 1610   GLUCOSE 115* 07/08/2011 1610   CALCIUM 9.4 07/08/2011 1610   CBC:    Component Value Date/Time   WBC 8.1 07/08/2011 1610   HGB 10.3* 07/08/2011 1610   HCT 31.6* 07/08/2011 1610   PLT 151 07/08/2011 1610   MCV 95.2 07/08/2011 1610   NEUTROABS 6.1 07/08/2011 1610   LYMPHSABS 0.8 07/08/2011 1610   MONOABS 1.0 07/08/2011 1610   EOSABS 0.2 07/08/2011 1610   BASOSABS 0.0 07/08/2011 1610      Lab 07/08/11 1610  WBC 8.1  HGB 10.3*  HCT 31.6*  PLT 151  MCV 95.2  MCH 31.0  MCHC 32.6  RDW 15.6*   LYMPHSABS 0.8  MONOABS 1.0  EOSABS 0.2  BASOSABS 0.0  BANDABS --    Lab 07/08/11 1610  NA 136  K 3.4*  CL 99  CO2 26  GLUCOSE 115*  BUN 53*  CREATININE 2.48*  CALCIUM 9.4  MG --   No results found for this basename: INR:5,PROTIME:5 in the last 168 hours Cardiac markers: No results found for this basename: CK:3,CKMB:3,TROPONINI:3,MYOGLOBIN:3 in the last 168 hours No results found for this basename: POCBNP:3 in the last 168 hours No results found for this or any previous visit (from the past 240 hour(s)).  Studies/Results: No results found.  Medications: Scheduled Meds:   . acetaminophen  500 mg Oral BID  . aspirin EC  81 mg Oral Daily  . B-complex with vitamin C  2 tablet Oral Daily  . enoxaparin  30 mg Subcutaneous Q24H  . ferrous sulfate  325 mg Oral Q breakfast  . furosemide  80 mg Oral BID  . pantoprazole  40 mg Oral Q1200  . potassium chloride SA  20 mEq Oral Daily  . potassium chloride  40 mEq Oral Once  . rosuvastatin  5 mg Oral QPM  . vancomycin  500 mg Intravenous Q48H  . vancomycin  1,000 mg Intravenous Once  . DISCONTD: b complex vitamins  2 capsule Oral Daily   Continuous Infusions:  PRN Meds:.sodium chloride, HYDROmorphone, oxyCODONE, sodium chloride  Assessment/Plan:  Principal Problem:  *Cellulitis of left leg - continue vancomycin for now  Active Problems:  Chronic diastolic congestive heart failure - will continue lasix 80 mg BID PO  Chronic renal insufficiency - continue to monitor BUN/Cr  Hypokalemia - replete     LOS: 1 day   Imberly Troxler 07/09/2011, 10:41 AM

## 2011-07-10 MED ORDER — POTASSIUM CHLORIDE CRYS ER 20 MEQ PO TBCR
20.0000 meq | EXTENDED_RELEASE_TABLET | Freq: Once | ORAL | Status: AC
  Administered 2011-07-10: 20 meq via ORAL
  Filled 2011-07-10: qty 1

## 2011-07-10 NOTE — Progress Notes (Signed)
Patient ID: [name], female   DOB: 01/01/1922, 77 y.o.   MRN: [id number] Subjective: No events overnight. Patient denies chest pain, shortness of breath, abdominal pain.   Objective:  Vital signs in last 24 hours:  Filed Vitals:   07/09/11 1507 07/09/11 2201 07/10/11 0550 07/10/11 1440  BP: 100/51 106/70 106/64 103/63  Pulse: 70 65 64 67  Temp: 98 F (36.7 C) 98 F (36.7 C) 97.3 F (36.3 C) 98.8 F (37.1 C)  TempSrc: Oral Oral  Oral  Resp:  16 19 20   Height:      Weight:      SpO2: 95% 96% 96% 96%    Intake/Output from previous day:   Intake/Output Summary (Last 24 hours) at 07/10/11 1540 Last data filed at 07/10/11 1204  Gross per 24 hour  Intake    600 ml  Output    950 ml  Net   -350 ml    Physical Exam: General: Alert, awake, oriented x3, in no acute distress. HEENT: No bruits, no goiter. Moist mucous membranes, no scleral icterus, no conjunctival pallor. Heart: Regular rate and rhythm, without murmurs, rubs, gallops. Lungs: Clear to auscultation bilaterally. No wheezing, no rhonchi, no rales.  Abdomen: Soft, nontender, nondistended, positive bowel sounds. Extremities: No clubbing cyanosis or edema,  positive pedal pulses. Neuro: Grossly intact, nonfocal.    Lab Results:  Basic Metabolic Panel:    Component Value Date/Time   NA 136 07/08/2011 1610   K 3.4* 07/08/2011 1610   CL 99 07/08/2011 1610   CO2 26 07/08/2011 1610   BUN 53* 07/08/2011 1610   CREATININE 2.48* 07/08/2011 1610   GLUCOSE 115* 07/08/2011 1610   CALCIUM 9.4 07/08/2011 1610   CBC:    Component Value Date/Time   WBC 8.1 07/08/2011 1610   HGB 10.3* 07/08/2011 1610   HCT 31.6* 07/08/2011 1610   PLT 151 07/08/2011 1610   MCV 95.2 07/08/2011 1610   NEUTROABS 6.1 07/08/2011 1610   LYMPHSABS 0.8 07/08/2011 1610   MONOABS 1.0 07/08/2011 1610   EOSABS 0.2 07/08/2011 1610   BASOSABS 0.0 07/08/2011 1610      Lab 07/08/11 1610  WBC 8.1  HGB 10.3*  HCT 31.6*  PLT 151  MCV 95.2  MCH 31.0    MCHC 32.6  RDW 15.6*  LYMPHSABS 0.8  MONOABS 1.0  EOSABS 0.2  BASOSABS 0.0  BANDABS --    Lab 07/08/11 1610  NA 136  K 3.4*  CL 99  CO2 26  GLUCOSE 115*  BUN 53*  CREATININE 2.48*  CALCIUM 9.4  MG --   No results found for this basename: INR:5,PROTIME:5 in the last 168 hours Cardiac markers: No results found for this basename: CK:3,CKMB:3,TROPONINI:3,MYOGLOBIN:3 in the last 168 hours No results found for this basename: POCBNP:3 in the last 168 hours No results found for this or any previous visit (from the past 240 hour(s)).  Studies/Results: No results found.  Medications: Scheduled Meds:   . acetaminophen  500 mg Oral BID  . aspirin EC  81 mg Oral Daily  . B-complex with vitamin C  2 tablet Oral Daily  . enoxaparin  30 mg Subcutaneous Q24H  . ferrous sulfate  325 mg Oral Q breakfast  . furosemide  80 mg Oral BID  . pantoprazole  40 mg Oral Q1200  . potassium chloride SA  20 mEq Oral Daily  . rosuvastatin  5 mg Oral QPM  . vancomycin  500 mg Intravenous Q48H   Continuous Infusions:  PRN Meds:.sodium chloride, HYDROmorphone, oxyCODONE, sodium chloride  Assessment/Plan:  Principal Problem:  *Cellulitis of left leg - significantly improving; continue same antibiotic regimen Active Problems:  Fever - patient afebrile for more than 24 hours, white blood cells within normal limit; follow up the results of blood cultures  Chronic diastolic congestive heart failure - continue lasix  HYPERTENSION - continue lasix  GERD - continue protonix    LOS: 2 days   Mikalah Skyles 07/10/2011, 3:40 PM

## 2011-07-11 LAB — BASIC METABOLIC PANEL
BUN: 46 mg/dL — ABNORMAL HIGH (ref 6–23)
CO2: 25 mEq/L (ref 19–32)
Chloride: 102 mEq/L (ref 96–112)
GFR calc non Af Amer: 20 mL/min — ABNORMAL LOW (ref >90)
Glucose, Bld: 103 mg/dL — ABNORMAL HIGH (ref 70–99)
Potassium: 4 mEq/L (ref 3.5–5.1)

## 2011-07-11 NOTE — Progress Notes (Signed)
Patient ID: [name], female   DOB: 06-12-1922, 77 y.o.   MRN: [id number] Subjective: No events overnight. Patient denies chest pain, shortness of breath, abdominal pain.   Objective:  Vital signs in last 24 hours:  Filed Vitals:   07/10/11 0550 07/10/11 1440 07/10/11 2200 07/11/11 0600  BP: 106/64 103/63 104/68 109/66  Pulse: 64 67 69 73  Temp: 97.3 F (36.3 C) 98.8 F (37.1 C) 98.7 F (37.1 C) 97.6 F (36.4 C)  TempSrc:  Oral    Resp: 19 20 18 18   Height:      Weight:      SpO2: 96% 96% 96% 96%    Intake/Output from previous day:   Intake/Output Summary (Last 24 hours) at 07/11/11 1235 Last data filed at 07/11/11 1007  Gross per 24 hour  Intake    600 ml  Output   1975 ml  Net  -1375 ml    Physical Exam: General: Alert, awake, oriented x3, in no acute distress. HEENT: No bruits, no goiter. Moist mucous membranes, no scleral icterus, no conjunctival pallor. Heart: Regular rate and rhythm, without murmurs, rubs, gallops. Lungs: Clear to auscultation bilaterally. No wheezing, no rhonchi, no rales.  Abdomen: Soft, nontender, nondistended, positive bowel sounds. Extremities: Erythema over left lower extremity  Neuro: Grossly intact, nonfocal.    Lab Results:  Basic Metabolic Panel:    Component Value Date/Time   NA 138 07/11/2011 0435   K 4.0 07/11/2011 0435   CL 102 07/11/2011 0435   CO2 25 07/11/2011 0435   BUN 46* 07/11/2011 0435   CREATININE 2.07* 07/11/2011 0435   GLUCOSE 103* 07/11/2011 0435   CALCIUM 9.0 07/11/2011 0435   CBC:    Component Value Date/Time   WBC 8.1 07/08/2011 1610   HGB 10.3* 07/08/2011 1610   HCT 31.6* 07/08/2011 1610   PLT 151 07/08/2011 1610   MCV 95.2 07/08/2011 1610   NEUTROABS 6.1 07/08/2011 1610   LYMPHSABS 0.8 07/08/2011 1610   MONOABS 1.0 07/08/2011 1610   EOSABS 0.2 07/08/2011 1610   BASOSABS 0.0 07/08/2011 1610      Lab 07/08/11 1610  WBC 8.1  HGB 10.3*  HCT 31.6*  PLT 151  MCV 95.2  MCH 31.0  MCHC 32.6  RDW 15.6*    LYMPHSABS 0.8  MONOABS 1.0  EOSABS 0.2  BASOSABS 0.0  BANDABS --    Lab 07/11/11 0435 07/08/11 1610  NA 138 136  K 4.0 3.4*  CL 102 99  CO2 25 26  GLUCOSE 103* 115*  BUN 46* 53*  CREATININE 2.07* 2.48*  CALCIUM 9.0 9.4  MG -- --   No results found for this basename: INR:5,PROTIME:5 in the last 168 hours Cardiac markers: No results found for this basename: CK:3,CKMB:3,TROPONINI:3,MYOGLOBIN:3 in the last 168 hours No results found for this basename: POCBNP:3 in the last 168 hours No results found for this or any previous visit (from the past 240 hour(s)).  Studies/Results: No results found.  Medications: Scheduled Meds:   . acetaminophen  500 mg Oral BID  . aspirin EC  81 mg Oral Daily  . B-complex with vitamin C  2 tablet Oral Daily  . enoxaparin  30 mg Subcutaneous Q24H  . ferrous sulfate  325 mg Oral Q breakfast  . furosemide  80 mg Oral BID  . pantoprazole  40 mg Oral Q1200  . potassium chloride SA  20 mEq Oral Daily  . potassium chloride  20 mEq Oral Once  . rosuvastatin  5  mg Oral QPM  . vancomycin  500 mg Intravenous Q48H   Continuous Infusions:  PRN Meds:.sodium chloride, HYDROmorphone, oxyCODONE, sodium chloride  Assessment/Plan:  Principal Problem:  *Cellulitis of left leg - significantly improving in terms of swelling but erythema and tenderness is still present; continue same antibiotic regimen with vancomycin and follow the results of blood cultures Active Problems:  Fever - likely related to cellulitis, patient remains afebrile for more than 24 hours  Chronic diastolic congestive heart failure -  continue Lasix 80 mg twice a day as patient has relatively good diuresis, 1.4 L in 24 hours  GERD - continue Protonix    LOS: 3 days   Daxter Paule 07/11/2011, 12:35 PM

## 2011-07-11 NOTE — Progress Notes (Signed)
Received call from ID to discontinue contact precautions. Done. Pt not on contact precautions at time of call. Vw, rn.

## 2011-07-12 MED ORDER — OXYCODONE HCL 5 MG PO TABS: 5.0000 mg | ORAL_TABLET | ORAL | Status: AC | PRN

## 2011-07-12 MED ORDER — SULFAMETHOXAZOLE-TRIMETHOPRIM 800-160 MG PO TABS: 1.0000 | ORAL_TABLET | Freq: Two times a day (BID) | ORAL | Status: AC

## 2011-07-12 NOTE — Progress Notes (Signed)
Patient set to discharge this afternoon to [facility]. MD has dictated the d/c summary, family will meet patient @ facility to complete admission paperwork. [facility] called for transport.   Unice Bailey, LCSWA [phone]

## 2011-07-12 NOTE — Discharge Summary (Addendum)
Admit date: 07/08/2011 Discharge date: 07/12/2011  Primary Care Physician:  Florentina Jenny, MD   Discharge Diagnoses:   Active Hospital Problems  Diagnoses Date Noted   . Cellulitis of left leg 07/08/2011   . Chronic diastolic congestive heart failure 07/08/2011   . GERD 07/15/2008   . HYPERTENSION 12/27/2006     Resolved Hospital Problems  Diagnoses Date Noted Date Resolved  . Fever 07/08/2011 07/12/2011     DISCHARGE MEDICATION: Current Discharge Medication List    START taking these medications   Details  oxyCODONE (OXY IR/ROXICODONE) 5 MG immediate release tablet Take 1 tablet (5 mg total) by mouth every 4 (four) hours as needed. Qty: 30 tablet, Refills: 0    sulfamethoxazole-trimethoprim (SEPTRA DS) 800-160 MG per tablet Take 1 tablet by mouth 2 (two) times daily.      CONTINUE these medications which have NOT CHANGED   Details  acetaminophen (TYLENOL) 500 MG tablet Take 500 mg by mouth 2 (two) times daily.      aspirin 81 MG tablet Take 81 mg by mouth daily.      b complex vitamins capsule Take 2 capsules by mouth daily.      calcium carbonate (OS-CAL) 600 MG TABS Take 600 mg by mouth daily.      ferrous sulfate 325 (65 FE) MG tablet Take 325 mg by mouth daily with breakfast.      furosemide (LASIX) 80 MG tablet Take 80 mg by mouth 2 (two) times daily.      omeprazole (PRILOSEC) 20 MG capsule Take 20 mg by mouth daily.      potassium chloride SA (K-DUR,KLOR-CON) 20 MEQ tablet Take 20 mEq by mouth daily.      rosuvastatin (CRESTOR) 5 MG tablet Take 5 mg by mouth every evening.        STOP taking these medications     amoxicillin-clavulanate (AUGMENTIN) 875-125 MG per tablet            Consults:     SIGNIFICANT DIAGNOSTIC STUDIES:  No results found.    No results found for this or any previous visit (from the past 240 hour(s)).  BRIEF ADMITTING H & P: [name] is a 77 y.o. female was past medical history of chronic diastolic CHF and mitral  regurgitation. Patient also have a stage III chronic kidney disease. Patient started to have a left lower extremity redness and swelling about 2 days ago. She reported low-grade fever and feeling chilly. Then her leg started to hurt. She contacted her primary care physician. Patient was started on Augmentin yesterday but the redness, swelling and the pain is worsening so patient decided to come to the hospital for further evaluation.   No resolved problems to display.  Active Hospital Problems  Diagnoses Date Noted   . Cellulitis of left leg: She was admitted to the hospital started on vancomycin She continued this treatment for 4 days. Her redness started to improve her swelling decreased significantly.  She was changed to Bactrim which she will continue to take this for a total of 10 days. Followup with her primary care doctor in next week. She will be going to skilled nursing facility. As it will be difficult for her to be taking care of herself at home.  07/08/2011   . Fever: This probably secondary to #1. She's had no fever in the last 48 hours. 07/08/2011   . Chronic diastolic congestive heart failure: Currently stable no changes were made.  07/08/2011   . GERD:  No changes were made.  07/15/2008   . HYPERTENSION: No changes were made to  12/27/2006     Resolved Hospital Problems     Disposition and Follow-up:  Discharge Orders    Future Orders Please Complete By Expires   Diet - low sodium heart healthy      Increase activity slowly        Follow-up Information    Follow up with TRIPP,HENRY in 1 week.          DISCHARGE EXAM:  General: Alert, awake, oriented x3, in no acute distress.  HEENT: No bruits, no goiter.  Heart: Regular rate and rhythm, without murmurs, rubs, gallops.  Lungs: Good air movement and clear to auscultation.  Abdomen: Soft, nontender, nondistended, positive bowel sounds.  Extremities: Everything is significantly decreased from initial mark on  admission. Her her swelling has decreased. Her skin now shows normal ankles. She still has dry skin. Neuro: Grossly intact, nonfocal.   Blood pressure 110/63, pulse 63, temperature 97.9 F (36.6 C), temperature source Oral, resp. rate 16, height 5\' 4"  (1.626 m), weight 54.25 kg (119 lb 9.6 oz), SpO2 95.00%.   Basename 07/11/11 0435  NA 138  K 4.0  CL 102  CO2 25  GLUCOSE 103*  BUN 46*  CREATININE 2.07*  CALCIUM 9.0  MG --  PHOS --   No results found for this basename: AST:2,ALT:2,ALKPHOS:2,BILITOT:2,PROT:2,ALBUMIN:2 in the last 72 hours No results found for this basename: LIPASE:2,AMYLASE:2 in the last 72 hours No results found for this basename: WBC:2,NEUTROABS:2,HGB:2,HCT:2,MCV:2,PLT:2 in the last 72 hours  Signed: Marinda Elk M.D. 07/12/2011, 12:14 PM

## 2011-07-12 NOTE — Progress Notes (Addendum)
Patient Details the right Name: [name] MRN: [id number] DOB: 06/21/1922 Problem List:  Patient Active Problem List  Diagnoses  . ANEMIA-IRON DEFICIENCY  . ANEMIA OF CHRONIC DISEASE  . GLAUCOMA NOS  . HYPERTENSION  . CONGESTIVE HEART FAILURE, SYSTOLIC DYSFUNCTION  . ESOPHAGEAL STRICTURE  . GERD  . BARRETT'S ESOPHAGUS  . HIATAL HERNIA  . DIVERTICULOSIS, COLON  . RECTAL BLEEDING  . DEGENERATIVE JOINT DISEASE  . OSTEOPOROSIS  . LEG EDEMA, BILATERAL  . DIARRHEA  . COLONIC POLYPS, ADENOMATOUS, HX OF  . CARDIOMEGALY  . Cellulitis  . Cellulitis of left leg  . Chronic diastolic congestive heart failure  . Mitral regurgitation  . Tricuspid regurgitation    Past Medical History:  Past Medical History  Diagnosis Date  . CHF (congestive heart failure)   . Hypertension   . Cellulitis   . Arthritis   . Chronic kidney disease, stage III (moderate)   . Chronic anemia    Past Surgical History:  Past Surgical History  Procedure Date  . Knee surgery   . Elbow surgery     OT Assessment/Plan/Recommendation OT Assessment Clinical Impression Statement: Skilled OT to maximize I w LB ADLs to setup/supervision for d/c to next venue of care OT Recommendation/Assessment: Patient will need skilled OT in the acute care venue OT Problem List: Decreased activity tolerance;Decreased knowledge of use of DME or AE Barriers to Discharge: Decreased caregiver support OT Therapy Diagnosis : Generalized weakness OT Plan OT Frequency: Min 1X/week OT Treatment/Interventions: Self-care/ADL training;DME and/or AE instruction;Therapeutic activities;Patient/family education OT Recommendation Follow Up Recommendations: Skilled nursing facility Equipment Recommended: Defer to next venue Individuals Consulted Consulted and Agree with Results and Recommendations: Patient OT Goals Acute Rehab OT Goals OT Goal Formulation: With patient Time For Goal Achievement: 2 weeks ADL Goals Pt Will  Perform Lower Body Bathing: with set-up;Sit to stand from bed ADL Goal: Lower Body Bathing - Progress: Progressing toward goals Pt Will Perform Lower Body Dressing: with set-up;Sit to stand from bed;with adaptive equipment ADL Goal: Lower Body Dressing - Progress: Progressing toward goals Pt Will Transfer to Toilet: with supervision;Ambulation;with DME;3-in-1;Regular height toilet ADL Goal: Toilet Transfer - Progress: Progressing toward goals Pt Will Perform Toileting - Clothing Manipulation: with supervision;Other (comment) (sit<>stand from toilet or 3:1) ADL Goal: Toileting - Clothing Manipulation - Progress: Progressing toward goals Pt Will Perform Toileting - Hygiene: with supervision;Sit to stand from 3-in-1/toilet ADL Goal: Toileting - Hygiene - Progress: Progressing toward goals  OT Evaluation Precautions/Restrictions  Precautions Precautions: Fall Restrictions Weight Bearing Restrictions: No Prior Functioning   Prior Function Comments: Plan now is for d/c to snf ADL ADL Eating/Feeding: Performed;Independent Where Assessed - Eating/Feeding: Chair Grooming: Performed;Wash/dry hands;Set up Where Assessed - Grooming: Other (comment) (seated on 3:1) Upper Body Bathing: Simulated;Set up Where Assessed - Upper Body Bathing: Other (comment) (seated on 3:1) Lower Body Bathing: Simulated;Moderate assistance;Other (comment) (for lower legs & feet) Where Assessed - Lower Body Bathing: Other (comment) (sit<>stand from 3:1) Upper Body Dressing: Simulated;Set up (seated on 3:1) Where Assessed - Upper Body Dressing: Other (comment) (seated on 3:1) Lower Body Dressing: Simulated;Moderate assistance;Other (comment) (to thread LEs into pant legs, donning socks) Where Assessed - Lower Body Dressing: Other (comment) (sit<>stand from 3:1) Toilet Transfer: Performed;Other (comment) (minguard A w/RW) Toilet Transfer Method: Proofreader: Set designer -  Clothing Manipulation: Performed;Other (comment) (minguard A w/RW) Where Assessed - Toileting Clothing Manipulation: Sit to stand from 3-in-1 or toilet Toileting - Hygiene: Performed;Other (comment) (minguard A w/RW)  Where Assessed - Toileting Hygiene: Sit to stand from 3-in-1 or toilet Tub/Shower Transfer: Not assessed Tub/Shower Transfer Method: Not assessed Equipment Used: Rolling walker;Other (comment) (3:1) ADL Comments: Limited activity tolerance, fatigues quickly.+2 pitting edema LLE Vision/Perception  Vision - History Baseline Vision: Wears glasses all the time Visual History: Glaucoma;Cataracts;Other (comment) (sx of both) Patient Visual Report: No change from baseline Vision - Assessment Vision Assessment: Vision not tested Cognition Cognition Arousal/Alertness: Awake/alert Overall Cognitive Status: Appears within functional limits for tasks assessed Sensation/Coordination   Extremity Assessment RUE Assessment RUE Assessment: Within Functional Limits LUE Assessment LUE Assessment: Within Functional Limits Mobility  Bed Mobility Bed Mobility: Yes Sit to Supine - Left: 5: Supervision Transfers Transfers: Yes Sit to Stand: From chair/3-in-1;Other (comment) (minguard A w/RW) Stand to Sit: Other (comment) (minguard A w/RW) Exercises   End of Session OT - End of Session Equipment Utilized During Treatment: Other (comment) (RW & 3:1) Activity Tolerance: Patient tolerated treatment well Patient left: in bed;with call bell in reach General Behavior During Session: [facility] for tasks performed Cognition: [facility] for tasks performed   Samik Balkcom A 07/12/2011, 3:51 PM

## 2011-07-12 NOTE — Progress Notes (Signed)
Occupational Therapy Evaluation Patient Details Name: [name] MRN: [id number] DOB: 10-May-1922 Today's Date: 07/12/2011 1500-1530 EV2 Problem List:  Patient Active Problem List  Diagnoses  . ANEMIA-IRON DEFICIENCY  . ANEMIA OF CHRONIC DISEASE  . GLAUCOMA NOS  . HYPERTENSION  . CONGESTIVE HEART FAILURE, SYSTOLIC DYSFUNCTION  . ESOPHAGEAL STRICTURE  . GERD  . BARRETT'S ESOPHAGUS  . HIATAL HERNIA  . DIVERTICULOSIS, COLON  . RECTAL BLEEDING  . DEGENERATIVE JOINT DISEASE  . OSTEOPOROSIS  . LEG EDEMA, BILATERAL  . DIARRHEA  . COLONIC POLYPS, ADENOMATOUS, HX OF  . CARDIOMEGALY  . Cellulitis  . Cellulitis of left leg  . Chronic diastolic congestive heart failure  . Mitral regurgitation  . Tricuspid regurgitation    Past Medical History:  Past Medical History  Diagnosis Date  . CHF (congestive heart failure)   . Hypertension   . Cellulitis   . Arthritis   . Chronic kidney disease, stage III (moderate)   . Chronic anemia    Past Surgical History:  Past Surgical History  Procedure Date  . Knee surgery   . Elbow surgery     OT Assessment/Plan/Recommendation OT Assessment Clinical Impression Statement: Skilled OT to maximize I w LB ADLs to setup/supervision for d/c to next venue of care OT Recommendation/Assessment: Patient will need skilled OT in the acute care venue OT Problem List: Decreased activity tolerance;Decreased knowledge of use of DME or AE Barriers to Discharge: Decreased caregiver support OT Therapy Diagnosis : Generalized weakness OT Plan OT Frequency: Min 1X/week OT Treatment/Interventions: Self-care/ADL training;DME and/or AE instruction;Therapeutic activities;Patient/family education OT Recommendation Follow Up Recommendations: Skilled nursing facility Equipment Recommended: Defer to next venue Individuals Consulted Consulted and Agree with Results and Recommendations: Patient OT Goals Acute Rehab OT Goals OT Goal Formulation: With  patient Time For Goal Achievement: 2 weeks ADL Goals Pt Will Perform Lower Body Bathing: with set-up;Sit to stand from bed ADL Goal: Lower Body Bathing - Progress: Progressing toward goals Pt Will Perform Lower Body Dressing: with set-up;Sit to stand from bed;with adaptive equipment ADL Goal: Lower Body Dressing - Progress: Progressing toward goals Pt Will Transfer to Toilet: with supervision;Ambulation;with DME;3-in-1;Regular height toilet ADL Goal: Toilet Transfer - Progress: Progressing toward goals Pt Will Perform Toileting - Clothing Manipulation: with supervision;Other (comment) (sit<>stand from toilet or 3:1) ADL Goal: Toileting - Clothing Manipulation - Progress: Progressing toward goals Pt Will Perform Toileting - Hygiene: with supervision;Sit to stand from 3-in-1/toilet ADL Goal: Toileting - Hygiene - Progress: Progressing toward goals  OT Evaluation Precautions/Restrictions  Precautions Precautions: Fall Restrictions Weight Bearing Restrictions: No Prior Functioning   Prior Function Comments: Plan now is for d/c to snf ADL ADL Eating/Feeding: Performed;Independent Where Assessed - Eating/Feeding: Chair Grooming: Performed;Wash/dry hands;Set up Where Assessed - Grooming: Other (comment) (seated on 3:1) Upper Body Bathing: Simulated;Set up Where Assessed - Upper Body Bathing: Other (comment) (seated on 3:1) Lower Body Bathing: Simulated;Moderate assistance;Other (comment) (for lower legs & feet) Where Assessed - Lower Body Bathing: Other (comment) (sit<>stand from 3:1) Upper Body Dressing: Simulated;Set up (seated on 3:1) Where Assessed - Upper Body Dressing: Other (comment) (seated on 3:1) Lower Body Dressing: Simulated;Moderate assistance;Other (comment) (to thread LEs into pant legs, donning socks) Where Assessed - Lower Body Dressing: Other (comment) (sit<>stand from 3:1) Toilet Transfer: Performed;Other (comment) (minguard A w/RW) Toilet Transfer Method:  Proofreader: Set designer - Clothing Manipulation: Performed;Other (comment) (minguard A w/RW) Where Assessed - Toileting Clothing Manipulation: Sit to stand from 3-in-1 or toilet Toileting -  Hygiene: Performed;Other (comment) (minguard A w/RW) Where Assessed - Toileting Hygiene: Sit to stand from 3-in-1 or toilet Tub/Shower Transfer: Not assessed Tub/Shower Transfer Method: Not assessed Equipment Used: Rolling walker;Other (comment) (3:1) ADL Comments: Limited activity tolerance, fatigues quickly.+2 pitting edema LLE Vision/Perception  Vision - History Baseline Vision: Wears glasses all the time Visual History: Glaucoma;Cataracts;Other (comment) (sx of both) Patient Visual Report: No change from baseline Vision - Assessment Vision Assessment: Vision not tested Cognition Cognition Arousal/Alertness: Awake/alert Overall Cognitive Status: Appears within functional limits for tasks assessed Sensation/Coordination   Extremity Assessment RUE Assessment RUE Assessment: Within Functional Limits LUE Assessment LUE Assessment: Within Functional Limits Mobility  Bed Mobility Bed Mobility: Yes Sit to Supine - Left: 5: Supervision Transfers Transfers: Yes Sit to Stand: From chair/3-in-1;Other (comment) (minguard A w/RW) Stand to Sit: Other (comment) (minguard A w/RW) Exercises   End of Session OT - End of Session Equipment Utilized During Treatment: Other (comment) (RW & 3:1) Activity Tolerance: Patient tolerated treatment well Patient left: in bed;with call bell in reach General Behavior During Session: [facility] for tasks performed Cognition: [facility] for tasks performed   Kennadee Walthour A OTR/L [phone]  07/12/2011, 3:56 PM

## 2011-07-12 NOTE — Progress Notes (Signed)
CSW spoke with patient & sister, Mora Bellman #[phone] re: SNF. Patient had been to [facility] in the past and would like to return there for rehab. CSW completed FL2 and faxed information to them. CSW will follow-up with bed offer.   Unice Bailey, [place] [phone]  See shadow chart for yellow clinical social work assessment & placement note.

## 2011-07-12 NOTE — Progress Notes (Addendum)
Physical Therapy Evaluation Patient Details Name: [name] MRN: [id number] DOB: 09-Mar-1922 Today's Date: 07/12/2011 1002-1027 eval 2  Problem List:  Patient Active Problem List  Diagnoses  . ANEMIA-IRON DEFICIENCY  . ANEMIA OF CHRONIC DISEASE  . GLAUCOMA NOS  . HYPERTENSION  . CONGESTIVE HEART FAILURE, SYSTOLIC DYSFUNCTION  . ESOPHAGEAL STRICTURE  . GERD  . BARRETT'S ESOPHAGUS  . HIATAL HERNIA  . DIVERTICULOSIS, COLON  . RECTAL BLEEDING  . DEGENERATIVE JOINT DISEASE  . OSTEOPOROSIS  . LEG EDEMA, BILATERAL  . DIARRHEA  . COLONIC POLYPS, ADENOMATOUS, HX OF  . CARDIOMEGALY  . Cellulitis  . Cellulitis of left leg  . Fever  . Chronic diastolic congestive heart failure  . Mitral regurgitation  . Tricuspid regurgitation    Past Medical History:  Past Medical History  Diagnosis Date  . CHF (congestive heart failure)   . Hypertension   . Cellulitis   . Arthritis   . Chronic kidney disease, stage III (moderate)   . Chronic anemia    Past Surgical History:  Past Surgical History  Procedure Date  . Knee surgery   . Elbow surgery     PT Assessment/Plan/Recommendation PT Assessment Clinical Impression Statement: pt presents with LLE cellulitis, weakness, overall deconditioning and will benefit from PT to maximize independence for next venue of care PT Recommendation/Assessment: Patient will need skilled PT in the acute care venue PT Problem List: Decreased strength;Decreased activity tolerance;Decreased mobility;Pain;Decreased knowledge of use of DME Barriers to Discharge: Decreased caregiver support Barriers to Discharge Comments: pt states husband "not doing well and can't help much" PT Therapy Diagnosis : Difficulty walking;Generalized weakness PT Plan PT Frequency: Min 3X/week PT Treatment/Interventions: DME instruction;Gait training;Functional mobility training;Therapeutic activities;Therapeutic exercise;Balance training;Patient/family education PT  Recommendation Follow Up Recommendations: Skilled nursing facility (vs. HHPT depending on progress) NO DME recommended by PT   PT Goals  Acute Rehab PT Goals PT Goal Formulation: With patient Time For Goal Achievement: 2 weeks Pt will go Supine/Side to Sit: with supervision PT Goal: Supine/Side to Sit - Progress: Progressing toward goal Pt will go Sit to Supine/Side: with supervision PT Goal: Sit to Supine/Side - Progress: Other (comment) Pt will go Sit to Stand: with supervision PT Goal: Sit to Stand - Progress: Progressing toward goal Pt will go Stand to Sit: with supervision PT Goal: Stand to Sit - Progress: Progressing toward goal Pt will Ambulate: 51 - 150 feet PT Goal: Ambulate - Progress: Progressing toward goal Pt will Perform Home Exercise Program: with supervision, verbal cues required/provided  PT Evaluation Precautions/Restrictions  Precautions Precautions: Fall Prior Functioning  Home Living Lives With:  (pt also has aide 5-7d/wk)  "she comes as often as she can" Receives Help From: Family (brother and SIL were assisting with errands; ) Type of Home: House Home Layout: One level (1 step into laundry room??) Home Access: Stairs to enter Entrance Stairs-Rails: Right;Left (can't reach both) Entrance Stairs-Number of Steps: 3 Bathroom Shower/Tub: Walk-in shower (grab bars) Home Adaptive Equipment: Walker - rolling;Straight cane  Prior Function Level of Independence: Independent with gait;Independent with transfers Cognition Cognition Arousal/Alertness: Awake/alert Overall Cognitive Status: Appears within functional limits for tasks assessed Orientation Level: Oriented X4 Sensation/Coordination   Extremity Assessment RLE Assessment RLE Assessment: Within Functional Limits (grossly) LLE Assessment LLE Assessment:  (not fully tested d/t pain; at least 3 to 3+/5) Mobility (including Balance) Bed Mobility Supine to Sit: 4: Min assist Supine to Sit Details  (indicate cue type and reason): increase times, cues for wt shift  Sitting - Scoot to [place] of Bed: 4: Min assist Sitting - Scoot to [place] of Bed Details (indicate cue type and reason): assist to prevent posterior LOB; cues for use of UEs Transfers Sit to Stand: 3: Mod assist;From bed Sit to Stand Details (indicate cue type and reason): cues and assist for anterior wt shift to stand;  Stand to Sit: 4: Min assist;To chair/3-in-1 Stand to Sit Details: min/guard to control descent; cues for hands Ambulation/Gait Ambulation/Gait Assistance: 4: Min assist Ambulation/Gait Assistance Details (indicate cue type and reason): cues for sequence, RW safety, posture, use of UEs Ambulation Distance (Feet): 20 Feet Assistive device: Rolling walker Gait Pattern: Antalgic;Step-to pattern    PT - End of Session Activity Tolerance: Patient limited by pain;Patient limited by fatigue Patient left: in chair General Behavior During Session: [facility] for tasks performed Cognition: [facility] for tasks performed  [facility] 07/12/2011, 10:55 AM

## 2013-02-12 ENCOUNTER — Encounter (HOSPITAL_COMMUNITY): Payer: Self-pay | Admitting: Family Medicine

## 2013-02-12 ENCOUNTER — Emergency Department (HOSPITAL_COMMUNITY): Payer: Medicare Other

## 2013-02-12 ENCOUNTER — Emergency Department (HOSPITAL_COMMUNITY)
Admission: EM | Admit: 2013-02-12 | Discharge: 2013-02-12 | Disposition: A | Discharge status: Home / Self Care | Payer: Medicare Other | Attending: Emergency Medicine | Admitting: Emergency Medicine

## 2013-02-12 DIAGNOSIS — R12 Heartburn: Secondary | ICD-10-CM | POA: Insufficient documentation

## 2013-02-12 DIAGNOSIS — M129 Arthropathy, unspecified: Secondary | ICD-10-CM | POA: Insufficient documentation

## 2013-02-12 DIAGNOSIS — I509 Heart failure, unspecified: Secondary | ICD-10-CM | POA: Insufficient documentation

## 2013-02-12 DIAGNOSIS — I129 Hypertensive chronic kidney disease with stage 1 through stage 4 chronic kidney disease, or unspecified chronic kidney disease: Secondary | ICD-10-CM | POA: Insufficient documentation

## 2013-02-12 DIAGNOSIS — D649 Anemia, unspecified: Secondary | ICD-10-CM | POA: Insufficient documentation

## 2013-02-12 DIAGNOSIS — N183 Chronic kidney disease, stage 3 unspecified: Secondary | ICD-10-CM | POA: Insufficient documentation

## 2013-02-12 DIAGNOSIS — N289 Disorder of kidney and ureter, unspecified: Secondary | ICD-10-CM

## 2013-02-12 DIAGNOSIS — Z7982 Long term (current) use of aspirin: Secondary | ICD-10-CM | POA: Insufficient documentation

## 2013-02-12 DIAGNOSIS — Z79899 Other long term (current) drug therapy: Secondary | ICD-10-CM | POA: Insufficient documentation

## 2013-02-12 LAB — BASIC METABOLIC PANEL
GFR calc Af Amer: 19 mL/min — ABNORMAL LOW (ref >90)
GFR calc non Af Amer: 17 mL/min — ABNORMAL LOW (ref >90)
Glucose, Bld: 100 mg/dL — ABNORMAL HIGH (ref 70–99)
Potassium: 4.7 mEq/L (ref 3.5–5.1)
Sodium: 141 mEq/L (ref 135–145)

## 2013-02-12 LAB — TROPONIN I: Troponin I: <0.3 ng/mL (ref <0.30)

## 2013-02-12 LAB — CBC
Hemoglobin: 12.4 g/dL (ref 12.0–15.0)
MCHC: 32.5 g/dL (ref 30.0–36.0)
Platelets: 258 10*3/uL (ref 150–400)

## 2013-02-12 LAB — OCCULT BLOOD, POC DEVICE: Fecal Occult Bld: NEGATIVE

## 2013-02-12 LAB — PRO B NATRIURETIC PEPTIDE: Pro B Natriuretic peptide (BNP): 4238 pg/mL — ABNORMAL HIGH (ref 0–450)

## 2013-02-12 NOTE — ED Provider Notes (Signed)
History    CSN: [id number] Arrival date & time 02/12/13  1440  First MD Initiated Contact with Patient 02/12/13 1556     Chief Complaint  Patient presents with  . Heartburn   Patient is a 79 y.o. female presenting with heartburn. The history is provided by the patient and a relative.  Heartburn This is a recurrent problem. The current episode started more than 1 week ago. The problem occurs daily. The problem has been gradually worsening. Pertinent negatives include no chest pain, no abdominal pain and no shortness of breath. The symptoms are aggravated by eating. The symptoms are relieved by rest. She has tried nothing for the symptoms.  pt reports she has had heart burn for past several months, typically with eating She reports it has been increasing in frequency, almost always with eating She reports mild weakness when she has heartburn, but no SOB No syncope No nausea/vomiting/diarrhea She does report dark stool but thinks it is due to iron No back pain No Ha No focal weakness She has no symptoms at this time She reports last episode around lunch time (12pm) Lasted about 20 minutes then resolved Past Medical History  Diagnosis Date  . CHF (congestive heart failure)   . Hypertension   . Cellulitis   . Arthritis   . Chronic kidney disease, stage III (moderate)   . Chronic anemia    Past Surgical History  Procedure Laterality Date  . Knee surgery    . Elbow surgery     History reviewed. No pertinent family history. History  Substance Use Topics  . Smoking status: Never Smoker   . Smokeless tobacco: Not on file  . Alcohol Use: No   OB History   Grav Para Term Preterm Abortions TAB SAB Ect Mult Living                 Review of Systems  Respiratory: Negative for shortness of breath.   Cardiovascular: Negative for chest pain.  Gastrointestinal: Positive for heartburn. Negative for abdominal pain.  Neurological: Negative for syncope.  All other systems reviewed  and are negative.    Allergies  Codeine phosphate and Morphine sulfate  Home Medications   Current Outpatient Rx  Name  Route  Sig  Dispense  Refill  . acetaminophen (TYLENOL) 500 MG tablet   Oral   Take 500 mg by mouth 2 (two) times daily.           Marland Kitchen aspirin 81 MG tablet   Oral   Take 81 mg by mouth daily.           Marland Kitchen b complex vitamins tablet   Oral   Take 2 tablets by mouth daily.         . Calcium Carbonate-Vitamin D (CALCIUM-VITAMIN D) 600-200 MG-UNIT CAPS   Oral   Take 1 tablet by mouth daily.         . ferrous sulfate 325 (65 FE) MG tablet   Oral   Take 325 mg by mouth daily with breakfast.           . furosemide (LASIX) 80 MG tablet   Oral   Take 80 mg by mouth daily.          Marland Kitchen omeprazole (PRILOSEC) 20 MG capsule   Oral   Take 20 mg by mouth daily.           . rosuvastatin (CRESTOR) 5 MG tablet   Oral   Take 5 mg by mouth  every evening.            BP 177/90  Pulse 90  Temp(Src) 97.6 F (36.4 C)  Resp 18  SpO2 99% BP 130/75  Pulse 73  Temp(Src) 97.6 F (36.4 C)  Resp 13  SpO2 99%  Physical Exam CONSTITUTIONAL: elderly but well appearing, no distress HEAD: Normocephalic/atraumatic EYES: EOMI/PERRL ENMT: Mucous membranes moist NECK: supple no meningeal signs CV: S1/S2 noted, no murmurs/rubs/gallops noted LUNGS: Lungs are clear to auscultation bilaterally, no apparent distress ABDOMEN: soft, nontender, no rebound or guarding Rectal - stool color green.  No blood or melena.  Chaperone present NEURO: Pt is awake/alert, moves all extremitiesx4 EXTREMITIES: pulses normal, full ROM, no edema noted SKIN: warm, color normal PSYCH: no abnormalities of mood noted  ED Course  Procedures  Labs Reviewed  BASIC METABOLIC PANEL - Abnormal; Notable for the following:    Glucose, Bld 100 (*)    BUN 40 (*)    Creatinine, Ser 2.42 (*)    Calcium 11.2 (*)    GFR calc non Af Amer 17 (*)    GFR calc Af Amer 19 (*)    All other  components within normal limits  PRO B NATRIURETIC PEPTIDE - Abnormal; Notable for the following:    Pro B Natriuretic peptide (BNP) 4238.0 (*)    All other components within normal limits  CBC  TROPONIN I  TROPONIN I  OCCULT BLOOD, POC DEVICE   Dg Chest 2 View  02/12/2013   *RADIOLOGY REPORT*  Clinical Data:  Heartburn, history hypertension, CHF, stage III chronic kidney disease  CHEST - 2 VIEW  Comparison: 10/23/2010  Findings: Enlargement of cardiac silhouette. Decreased pulmonary vascular congestion since previous exam. Calcified tortuous thoracic aorta. Moderate sized hiatal hernia. Minimal atelectasis at medial left lower lobe. Underlying emphysematous and bronchitic changes. No acute infiltrate, pleural effusion or pneumothorax. Osseous demineralization with chronic compression deformity of a lower thoracic vertebra, stable. Thoracolumbar scoliosis.  IMPRESSION: Enlargement of cardiac silhouette with pulmonary vascular congestion. COPD/chronic bronchitic changes. Improved bibasilar aeration versus previous exam. Moderate sized hiatal hernia.   Original Report Authenticated By: Ulyses Southward, M.D.   5:45 PM Pt with heartburn for months seems worse recently Pt is well appearing, smiling, no distress, no active CP/SOB She presents with family, and pt is high functioning and appears at baseline Renal function appears near baseline Initial troponin negative Will repeat EKG/troponin at 3hr and reassess 7:26 PM Repeat troponin negative Pt has had no further symptoms No signs of CHF clinically (denied SOB to me) She does have abnormal EKG, but I doubt it is acute at this time She is well appearing, smiling, no distress and requesting d/c home I advised need to see her cardiologist for followup and we also discussed strict return precautions with patient/family  MDM  Nursing notes including past medical history and social history reviewed and considered in documentation xrays reviewed and  considered Labs/vital reviewed and considered      Date: 02/12/2013 1452  Rate: 89  Rhythm: normal sinus rhythm  QRS Axis: left  Intervals: normal  ST/T Wave abnormalities: nonspecific ST changes  Conduction Disutrbances:none  Narrative Interpretation: ST waves difficult to interpret in inferior leads due to PVC, will repeat EKG  Old EKG Reviewed: changes noted PVCs noted     Date: 02/12/2013 1759  Rate: 86  Rhythm: normal sinus rhythm  QRS Axis: left  Intervals: normal  ST/T Wave abnormalities: nonspecific ST changes  Conduction Disutrbances:none  Narrative Interpretation:  Old EKG Reviewed: compared to EKG from march 2012 inverted T waves more prominent in inferior leads, but were developing, no other acute ST changes     Joya Gaskins, MD 02/12/13 1929

## 2013-02-12 NOTE — ED Notes (Signed)
Per pt sts heartburn since Friday. sts she has been drinking water and it has been helping until today. sts she is also very weak. Denies N,V,SOB. Denies chest pain.

## 2013-08-19 ENCOUNTER — Encounter: Payer: Self-pay | Admitting: Cardiology

## 2013-08-20 ENCOUNTER — Ambulatory Visit: Payer: Medicare Other | Admitting: Cardiology

## 2013-08-31 ENCOUNTER — Encounter: Payer: Self-pay | Admitting: Cardiology

## 2013-08-31 DIAGNOSIS — I10 Essential (primary) hypertension: Secondary | ICD-10-CM | POA: Insufficient documentation

## 2013-08-31 DIAGNOSIS — I071 Rheumatic tricuspid insufficiency: Secondary | ICD-10-CM | POA: Insufficient documentation

## 2013-08-31 DIAGNOSIS — I509 Heart failure, unspecified: Secondary | ICD-10-CM | POA: Insufficient documentation

## 2013-09-08 ENCOUNTER — Ambulatory Visit (INDEPENDENT_AMBULATORY_CARE_PROVIDER_SITE_OTHER): Payer: Medicare HMO | Admitting: Cardiology

## 2013-09-08 ENCOUNTER — Encounter: Payer: Self-pay | Admitting: Cardiology

## 2013-09-08 VITALS — BP 128/64 | HR 71 | Ht 64.0 in | Wt 108.0 lb

## 2013-09-08 DIAGNOSIS — I071 Rheumatic tricuspid insufficiency: Secondary | ICD-10-CM

## 2013-09-08 DIAGNOSIS — I5032 Chronic diastolic (congestive) heart failure: Secondary | ICD-10-CM

## 2013-09-08 DIAGNOSIS — N184 Chronic kidney disease, stage 4 (severe): Secondary | ICD-10-CM

## 2013-09-08 DIAGNOSIS — R609 Edema, unspecified: Secondary | ICD-10-CM

## 2013-09-08 DIAGNOSIS — I079 Rheumatic tricuspid valve disease, unspecified: Secondary | ICD-10-CM

## 2013-09-08 DIAGNOSIS — I509 Heart failure, unspecified: Secondary | ICD-10-CM

## 2013-09-08 NOTE — Patient Instructions (Signed)
Your physician recommends that you continue on your current medications as directed. Please refer to the Current Medication list given to you today.  Your physician wants you to follow-up in: 6 months with Dr. Anne Fu. You will receive a reminder letter in the mail two months in advance. If you don't receive a letter, please call our office to schedule the follow-up appointment.  Cardiac Diet This diet can help prevent heart disease and stroke. Many factors influence your heart health, including eating and exercise habits. Coronary risk rises a lot with abnormal blood fat (lipid) levels. Cardiac meal planning includes limiting unhealthy fats, increasing healthy fats, and making other small dietary changes. General guidelines are as follows:  Adjust calorie intake to reach and maintain desirable body weight.  Limit total fat intake to less than 30% of total calories. Saturated fat should be less than 7% of calories.  Saturated fats are found in animal products and in some vegetable products. Saturated vegetable fats are found in coconut oil, cocoa butter, palm oil, and palm kernel oil. Read labels carefully to avoid these products as much as possible. Use butter in moderation. Choose tub margarines and oils that have 2 grams of fat or less. Good cooking oils are canola and olive oils.  Practice low-fat cooking techniques. Do not fry food. Instead, broil, bake, boil, steam, grill, roast on a rack, stir-fry, or microwave it. Other fat reducing suggestions include:  Remove the skin from poultry.  Remove all visible fat from meats.  Skim the fat off stews, soups, and gravies before serving them.  Steam vegetables in water or broth instead of sauting them in fat.  Avoid foods with trans fat (or hydrogenated oils), such as commercially fried foods and commercially baked goods. Commercial shortening and deep-frying fats will contain trans fat.  Increase intake of fruits, vegetables, whole grains,  and legumes to replace foods high in fat.  Increase consumption of nuts, legumes, and seeds to at least 4 servings weekly. One serving of a legume equals  cup, and 1 serving of nuts or seeds equals  cup.  Choose whole grains more often. Have 3 servings per day (a serving is 1 ounce [oz]).  Eat 4 to 5 servings of vegetables per day. A serving of vegetables is 1 cup of raw leafy vegetables;  cup of raw or cooked cut-up vegetables;  cup of vegetable juice.  Eat 4 to 5 servings of fruit per day. A serving of fruit is 1 medium whole fruit;  cup of dried fruit;  cup of fresh, frozen, or canned fruit;  cup of 100% fruit juice.  Increase your intake of dietary fiber to 20 to 30 grams per day. Insoluble fiber may help lower your risk of heart disease and may help curb your appetite.  Soluble fiber binds cholesterol to be removed from the blood. Foods high in soluble fiber are dried beans, citrus fruits, oats, apples, bananas, broccoli, Brussels sprouts, and eggplant.  Try to include foods fortified with plant sterols or stanols, such as yogurt, breads, juices, or margarines. Choose several fortified foods to achieve a daily intake of 2 to 3 grams of plant sterols or stanols.  Foods with omega-3 fats can help reduce your risk of heart disease. Aim to have a 3.5 oz portion of fatty fish twice per week, such as salmon, mackerel, albacore tuna, sardines, lake trout, or herring. If you wish to take a fish oil supplement, choose one that contains 1 gram of both DHA and EPA.  Limit processed meats to 2 servings (3 oz portion) weekly.  Limit the sodium in your diet to 1500 milligrams (mg) per day. If you have high blood pressure, talk to a registered dietitian about a DASH (Dietary Approaches to Stop Hypertension) eating plan.  Limit sweets and beverages with added sugar, such as soda, to no more than 5 servings per week. One serving is:   1 tablespoon sugar.  1 tablespoon jelly or jam.   cup  sorbet.  1 cup lemonade.   cup regular soda. CHOOSING FOODS Starches  Allowed: Breads: All kinds (wheat, rye, raisin, white, oatmeal, [place], [place], and English muffin bread). Low-fat rolls: English muffins, frankfurter and hamburger buns, bagels, pita bread, tortillas (not fried). Pancakes, waffles, biscuits, and muffins made with recommended oil.  Avoid: Products made with saturated or trans fats, oils, or whole milk products. Butter rolls, cheese breads, croissants. Commercial doughnuts, muffins, sweet rolls, biscuits, waffles, pancakes, store-bought mixes. Crackers  Allowed: Low-fat crackers and snacks: Animal, graham, rye, saltine (with recommended oil, no lard), oyster, and matzo crackers. Bread sticks, melba toast, rusks, flatbread, pretzels, and light popcorn.  Avoid: High-fat crackers: cheese crackers, butter crackers, and those made with coconut, palm oil, or trans fat (hydrogenated oils). Buttered popcorn. Cereals  Allowed: Hot or cold whole-grain cereals.  Avoid: Cereals containing coconut, hydrogenated vegetable fat, or animal fat. Potatoes / Pasta / Rice  Allowed: All kinds of potatoes, rice, and pasta (such as macaroni, spaghetti, and noodles).  Avoid: Pasta or rice prepared with cream sauce or high-fat cheese. Chow mein noodles, [place] fries. Vegetables  Allowed: All vegetables and vegetable juices.  Avoid: Fried vegetables. Vegetables in cream, butter, or high-fat cheese sauces. Limit coconut. Fruit in cream or custard. Protein  Allowed: Limit your intake of meat, seafood, and poultry to no more than 6 oz (cooked weight) per day. All lean, well-trimmed beef, veal, pork, and lamb. All chicken and [place] without skin. All fish and shellfish. Wild game: wild duck, rabbit, pheasant, and venison. Egg whites or low-cholesterol egg substitutes may be used as desired. Meatless dishes: recipes with dried beans, peas, lentils, and tofu (soybean curd). Seeds and nuts: all  seeds and most nuts.  Avoid: Prime grade and other heavily marbled and fatty meats, such as short ribs, spare ribs, rib eye roast or steak, frankfurters, sausage, bacon, and high-fat luncheon meats, mutton. Caviar. Commercially fried fish. Domestic duck, goose, venison sausage. Organ meats: liver, gizzard, heart, chitterlings, brains, kidney, sweetbreads. Dairy  Allowed: Low-fat cheeses: nonfat or low-fat cottage cheese (1% or 2% fat), cheeses made with part skim milk, such as mozzarella, farmers, string, or ricotta. (Cheeses should be labeled no more than 2 to 6 grams fat per oz.). Skim (or 1%) milk: liquid, powdered, or evaporated. Buttermilk made with low-fat milk. Drinks made with skim or low-fat milk or cocoa. Chocolate milk or cocoa made with skim or low-fat (1%) milk. Nonfat or low-fat yogurt.  Avoid: Whole milk cheeses, including colby, cheddar, mu[place]ster, [address] Jack, [place], [place], [place]ember[place], [address], Swiss, and blue. Creamed cottage cheese, cream cheese. Whole milk and whole milk products, including buttermilk or yogurt made from whole milk, drinks made from whole milk. Condensed milk, evaporated whole milk, and 2% milk. Soups and Combination Foods  Allowed: Low-fat low-sodium soups: broth, dehydrated soups, homemade broth, soups with the fat removed, homemade cream soups made with skim or low-fat milk. Low-fat spaghetti, lasagna, chili, and Spanish rice if low-fat ingredients and low-fat cooking techniques are used.  Avoid: Cream soups  made with whole milk, cream, or high-fat cheese. All other soups. Desserts and Sweets  Allowed: Sherbet, fruit ices, gelatins, meringues, and angel food cake. Homemade desserts with recommended fats, oils, and milk products. Jam, jelly, honey, marmalade, sugars, and syrups. Pure sugar candy, such as gum drops, hard candy, jelly beans, marshmallows, mints, and small amounts of dark chocolate.  Avoid: Commercially prepared cakes, pies, cookies, frosting,  pudding, or mixes for these products. Desserts containing whole milk products, chocolate, coconut, lard, palm oil, or palm kernel oil. Ice cream or ice cream drinks. Candy that contains chocolate, coconut, butter, hydrogenated fat, or unknown ingredients. Buttered syrups. Fats and Oils  Allowed: Vegetable oils: safflower, sunflower, corn, soybean, cottonseed, sesame, canola, olive, or peanut. Non-hydrogenated margarines. Salad dressing or mayonnaise: homemade or commercial, made with a recommended oil. Low or nonfat salad dressing or mayonnaise.  Limit added fats and oils to 6 to 8 tsp per day (includes fats used in cooking, baking, salads, and spreads on bread). Remember to count the "hidden fats" in foods.  Avoid: Solid fats and shortenings: butter, lard, salt pork, bacon drippings. Gravy containing meat fat, shortening, or suet. Cocoa butter, coconut. Coconut oil, palm oil, palm kernel oil, or hydrogenated oils: these ingredients are often used in bakery products, nondairy creamers, whipped toppings, candy, and commercially fried foods. Read labels carefully. Salad dressings made of unknown oils, sour cream, or cheese, such as blue cheese and Roquefort. Cream, all kinds: half-and-half, light, heavy, or whipping. Sour cream or cream cheese (even if "light" or low-fat). Nondairy cream substitutes: coffee creamers and sour cream substitutes made with palm, palm kernel, hydrogenated oils, or coconut oil. Beverages  Allowed: Coffee (regular or decaffeinated), tea. Diet carbonated beverages, mineral water. Alcohol: Check with your caregiver. Moderation is recommended.  Avoid: Whole milk, regular sodas, and juice drinks with added sugar. Condiments  Allowed: All seasonings and condiments. Cocoa powder. "Cream" sauces made with recommended ingredients.  Avoid: Carob powder made with hydrogenated fats. SAMPLE MENU Breakfast   cup orange juice   cup oatmeal  1 slice toast  1 tsp margarine  1  cup skim milk Lunch  Mal[place]key sandwich with 2 oz [place]key, 2 slices bread  Lettuce and tomato slices  Fresh fruit  Carrot sticks  Coffee or tea Snack  Fresh fruit or low-fat crackers Dinner  3 oz lean ground beef  1 baked potato  1 tsp margarine   cup asparagus  Lettuce salad  1 tbs non-creamy dressing   cup peach slices  1 cup skim milk Document Released: 05/02/2008 Document Revised: 01/23/2012 Document Reviewed: 10/17/2011 ExitCare Patient Information 2014 [place] [place].

## 2013-09-08 NOTE — Progress Notes (Signed)
[address] [place], [place]  [address] Phone: [phone] Fax:  [phone]  Date:  09/08/2013   ID:  [name], DOB [birth year], MRN [id number]  PCP:  Florentina JennyRIPP, HENRY, MD   History of Present Illness: [name] is a 80 y.o. female with a hx of CRI, CHF and severe TR. Previous hospitalization for systolic heart failure. She had an emergency room visit on 02/12/13 with chief complaint of heartburn. This has been a recurring problem and symptoms started a week before the emergency room visit and started gradually worsening and occurring daily. Last about 20 minutes then resolved. She reports mild weakness when she has her heartburn but no significant shortness of breath. Creatinine was 2.4 to, BNP, pro BNP was 4238 troponin was normal. She was encouraged to followup with cardiology. Her EKG showed sinus rhythm, poor R wave progression, nonspecific ST-T wave changes, PVCs. This was from February 12, 2013. Her EKG today is unchanged.  When questioned, she clearly states that her symptoms are nonexertional. She asked if it would be okay for her to take a Tums. She stated that this helps relieve her symptoms. She is currently on low-dose omeprazole.  Previously, she presented to the hospital with a creatinine of 2, increasing cough, and moderate pleural effusion. Ejection fraction was 40-50% with severe tricuspid regurgitation. She also saw nephrologist. Her Aldactone was stopped due to renal function and Coreg stopped due to hypotension. There was some confusion over her medications and we did not know until recently that she is also followed by Dr Redmond Schoolripp, [facility] who has been monitoring her renal function, potassium and diuretics with most recent labs 01/31/11 --K 3.1, creat 2.2, BUN 43. Her Lasix is currently at 80 mg in am.  Patient denies chest pain, palpitations, dizziness, syncope, swelling, SOB, nor PND. She is accompanied by family member  Overall she is  maintaining her fluid very well. No orthopnea. Wearing compression hose.   Wt Readings from Last 3 Encounters:  09/08/13 108 lb (48.988 kg)  07/08/11 119 lb 9.6 oz (54.25 kg)  10/19/10 145 lb 6.1 oz (65.945 kg)     Past Medical History  Diagnosis Date  . CHF (congestive heart failure)   . Hypertension   . Cellulitis   . Arthritis   . Chronic kidney disease, stage III (moderate)   . Chronic anemia   . Severe tricuspid regurgitation     Past Surgical History  Procedure Laterality Date  . Knee surgery    . Elbow surgery      Current Outpatient Prescriptions  Medication Sig Dispense Refill  . aspirin 81 MG tablet Take 81 mg by mouth daily.        Marland Kitchen. b complex vitamins tablet Take 2 tablets by mouth daily.      . ferrous sulfate 325 (65 FE) MG tablet Take 325 mg by mouth daily with breakfast.        . furosemide (LASIX) 80 MG tablet Take 80 mg by mouth daily.       Marland Kitchen. loratadine (CLARITIN) 10 MG tablet Take 10 mg by mouth daily.      Marland Kitchen. omeprazole (PRILOSEC) 20 MG capsule Take 20 mg by mouth daily.        . rosuvastatin (CRESTOR) 5 MG tablet Take 5 mg by mouth every evening.        . vitamin C (ASCORBIC ACID) 500 MG tablet Take 500 mg by mouth daily.  No current facility-administered medications for this visit.    Allergies:    Allergies  Allergen Reactions  . Codeine Phosphate     REACTION: confusion  . Morphine Sulfate     REACTION: confusion    Social History:  The patient  reports that she has never smoked. She does not have any smokeless tobacco history on file. She reports that she does not drink alcohol or use illicit drugs.   ROS:  Please see the history of present illness.   Denies any syncope, bleeding, orthopnea, PND   PHYSICAL EXAM: VS:  BP 128/64  Pulse 71  Ht 5\' 4"  (1.626 m)  Wt 108 lb (48.988 kg)  BMI 18.53 kg/m2 Well nourished, well developed, in no acute distressElderly. HEENT: normal Neck: no JVD Cardiac:  normal S1, S2; RRR; Soft LLSB S  murmur Lungs:  clear to auscultation bilaterally, no wheezing, rhonchi or rales Abd: soft, nontender, no hepatomegaly Ext: no edemaWearing compression hose Skin: warm and dry Neuro: no focal abnormalities noted  EKG:  None today  ASSESSMENT AND PLAN:  1. Chronic kidney disease stage IV-currently seeing nephrology. 2. Severe tricuspid regurgitation-likely in part responsible for her lower extremity edema. Agree with Lasix. Surprisingly soft murmur. 3. Mildly reduced ejection fraction-maintaining very well. No significant orthopnea or shortness of breath. Dietary restrictions discussed. Low sodium.  Signed, Donato Schultz, MD [facility]  09/08/2013 8:53 AM

## 2014-03-18 ENCOUNTER — Ambulatory Visit (INDEPENDENT_AMBULATORY_CARE_PROVIDER_SITE_OTHER): Payer: Medicare HMO | Admitting: Cardiology

## 2014-03-18 ENCOUNTER — Encounter: Payer: Self-pay | Admitting: Cardiology

## 2014-03-18 VITALS — BP 92/54 | HR 81 | Ht 64.0 in | Wt 107.0 lb

## 2014-03-18 DIAGNOSIS — I5032 Chronic diastolic (congestive) heart failure: Secondary | ICD-10-CM

## 2014-03-18 DIAGNOSIS — I079 Rheumatic tricuspid valve disease, unspecified: Secondary | ICD-10-CM

## 2014-03-18 DIAGNOSIS — N184 Chronic kidney disease, stage 4 (severe): Secondary | ICD-10-CM

## 2014-03-18 DIAGNOSIS — I509 Heart failure, unspecified: Secondary | ICD-10-CM

## 2014-03-18 DIAGNOSIS — I071 Rheumatic tricuspid insufficiency: Secondary | ICD-10-CM

## 2014-03-18 DIAGNOSIS — I517 Cardiomegaly: Secondary | ICD-10-CM

## 2014-03-18 NOTE — Patient Instructions (Signed)
The current medical regimen is effective;  continue present plan and medications.  Follow up in 6 months with Dr. Skains.  You will receive a letter in the mail 2 months before you are due.  Please call us when you receive this letter to schedule your follow up appointment.  

## 2014-03-18 NOTE — Progress Notes (Signed)
[address] [place], [place]  [address] Phone: [phone] Fax:  [phone]  Date:  03/18/2014   ID:  [name], DOB 1922/01/04, MRN [id number]  PCP:  Florentina Jenny, MD   History of Present Illness: [name] is a 80 y.o. female with a hx of CRI, CHF and severe TR. Patient denies chest pain, palpitations, dizziness, syncope, swelling, SOB, nor PND. She is accompanied by daughter. She is very concerned over what she eats, very regimented.  She just turned 80 years old. Prior hospitalizations reviewed. Overall she is maintaining her fluid very well. No orthopnea. Wearing compression hose.   Wt Readings from Last 3 Encounters:  03/18/14 107 lb (48.535 kg)  09/08/13 108 lb (48.988 kg)  07/08/11 119 lb 9.6 oz (54.25 kg)     Past Medical History  Diagnosis Date  . CHF (congestive heart failure)   . Hypertension   . Cellulitis   . Arthritis   . Chronic kidney disease, stage III (moderate)   . Chronic anemia   . Severe tricuspid regurgitation     Past Surgical History  Procedure Laterality Date  . Knee surgery    . Elbow surgery      Current Outpatient Prescriptions  Medication Sig Dispense Refill  . acetaminophen (TYLENOL) 500 MG tablet As needed      . aspirin 81 MG tablet Take 81 mg by mouth daily.        Marland Kitchen b complex vitamins tablet Take 2 tablets by mouth daily.      . calcium carbonate (OS-CAL) 600 MG TABS tablet Take 600 mg by mouth daily with breakfast. With vitamin D      . ferrous sulfate 325 (65 FE) MG tablet Take 325 mg by mouth daily with breakfast.        . furosemide (LASIX) 40 MG tablet Take 40 mg by mouth daily.      Marland Kitchen omeprazole (PRILOSEC) 20 MG capsule Take 20 mg by mouth daily.        . rosuvastatin (CRESTOR) 5 MG tablet Take 5 mg by mouth every evening.         No current facility-administered medications for this visit.    Allergies:    Allergies  Allergen Reactions  . Codeine Phosphate     REACTION: confusion    . Morphine Sulfate     REACTION: confusion    Social History:  The patient  reports that she has never smoked. She does not have any smokeless tobacco history on file. She reports that she does not drink alcohol or use illicit drugs.   ROS:  Please see the history of present illness.   Denies any syncope, bleeding, orthopnea, PND   PHYSICAL EXAM: VS:  BP 92/54  Pulse 81  Ht 5\' 4"  (1.626 m)  Wt 107 lb (48.535 kg)  BMI 18.36 kg/m2 Well nourished, well developed, in no acute distressElderly. HEENT: normal Neck: no JVD Cardiac:  normal S1, S2; RRR; Soft LLSB S murmur Lungs:  clear to auscultation bilaterally, no wheezing, rhonchi or rales Abd: soft, nontender, no hepatomegaly Ext: no edemaWearing compression hose Skin: warm and dry Neuro: no focal abnormalities noted  EKG:  03/18/14-sinus rhythm, PVCs poor R wave progression, nonspecific ST-T wave changes, T wave inversion in inferior leads.  ASSESSMENT AND PLAN:  1. Chronic kidney disease stage IV-currently seeing nephrology. 2. Severe tricuspid regurgitation-likely in part responsible for her lower extremity edema. Agree with Lasix.  Surprisingly soft murmur. Currently well compensated 3. Mildly reduced ejection fraction-maintaining very well. No significant orthopnea or shortness of breath.  4. Chronic diastolic heart failure-she has been maintaining balance very well. She has been concerned over the foods that she eats. With her being underweight, I'm fine with her eating food such as macaroni and cheese. We discussed this at length. She seems to be nervous about this. I want her to liberalize her fluid intake. 5. 6 month f/u.  Signed, Donato SchultzMark Takiah Maiden, MD [facility]  03/18/2014 1:57 PM

## 2014-05-17 ENCOUNTER — Emergency Department (HOSPITAL_COMMUNITY)
Admission: EM | Admit: 2014-05-17 | Discharge: 2014-05-17 | Disposition: A | Discharge status: Home / Self Care | Payer: Medicare HMO | Attending: Emergency Medicine | Admitting: Emergency Medicine

## 2014-05-17 ENCOUNTER — Emergency Department (HOSPITAL_COMMUNITY): Payer: Medicare HMO

## 2014-05-17 ENCOUNTER — Encounter (HOSPITAL_COMMUNITY): Payer: Self-pay | Admitting: Emergency Medicine

## 2014-05-17 DIAGNOSIS — W51XXXA Accidental striking against or bumped into by another person, initial encounter: Secondary | ICD-10-CM | POA: Insufficient documentation

## 2014-05-17 DIAGNOSIS — Z79899 Other long term (current) drug therapy: Secondary | ICD-10-CM | POA: Insufficient documentation

## 2014-05-17 DIAGNOSIS — Y939 Activity, unspecified: Secondary | ICD-10-CM | POA: Insufficient documentation

## 2014-05-17 DIAGNOSIS — L03115 Cellulitis of right lower limb: Secondary | ICD-10-CM | POA: Diagnosis not present

## 2014-05-17 DIAGNOSIS — D649 Anemia, unspecified: Secondary | ICD-10-CM | POA: Diagnosis not present

## 2014-05-17 DIAGNOSIS — N183 Chronic kidney disease, stage 3 (moderate): Secondary | ICD-10-CM | POA: Diagnosis not present

## 2014-05-17 DIAGNOSIS — Z7982 Long term (current) use of aspirin: Secondary | ICD-10-CM | POA: Diagnosis not present

## 2014-05-17 DIAGNOSIS — S8991XA Unspecified injury of right lower leg, initial encounter: Secondary | ICD-10-CM | POA: Diagnosis present

## 2014-05-17 DIAGNOSIS — I129 Hypertensive chronic kidney disease with stage 1 through stage 4 chronic kidney disease, or unspecified chronic kidney disease: Secondary | ICD-10-CM | POA: Insufficient documentation

## 2014-05-17 DIAGNOSIS — Y929 Unspecified place or not applicable: Secondary | ICD-10-CM | POA: Insufficient documentation

## 2014-05-17 DIAGNOSIS — I509 Heart failure, unspecified: Secondary | ICD-10-CM | POA: Insufficient documentation

## 2014-05-17 DIAGNOSIS — M1990 Unspecified osteoarthritis, unspecified site: Secondary | ICD-10-CM | POA: Insufficient documentation

## 2014-05-17 DIAGNOSIS — Z9889 Other specified postprocedural states: Secondary | ICD-10-CM | POA: Insufficient documentation

## 2014-05-17 DIAGNOSIS — R609 Edema, unspecified: Secondary | ICD-10-CM

## 2014-05-17 LAB — CBC WITH DIFFERENTIAL/PLATELET
BASOS ABS: 0 10*3/uL (ref 0.0–0.1)
Basophils Relative: 0 % (ref 0–1)
EOS PCT: 1 % (ref 0–5)
Eosinophils Absolute: 0 10*3/uL (ref 0.0–0.7)
HCT: 33.1 % — ABNORMAL LOW (ref 36.0–46.0)
Hemoglobin: 10.9 g/dL — ABNORMAL LOW (ref 12.0–15.0)
LYMPHS ABS: 0.9 10*3/uL (ref 0.7–4.0)
LYMPHS PCT: 16 % (ref 12–46)
MCH: 31.3 pg (ref 26.0–34.0)
MCHC: 32.9 g/dL (ref 30.0–36.0)
MCV: 95.1 fL (ref 78.0–100.0)
Monocytes Absolute: 0.6 10*3/uL (ref 0.1–1.0)
Monocytes Relative: 10 % (ref 3–12)
NEUTROS ABS: 3.9 10*3/uL (ref 1.7–7.7)
NEUTROS PCT: 73 % (ref 43–77)
Platelets: 233 10*3/uL (ref 150–400)
RBC: 3.48 MIL/uL — AB (ref 3.87–5.11)
RDW: 13.1 % (ref 11.5–15.5)
WBC: 5.4 10*3/uL (ref 4.0–10.5)

## 2014-05-17 LAB — COMPREHENSIVE METABOLIC PANEL
ALK PHOS: 51 U/L (ref 39–117)
ALT: 9 U/L (ref 0–35)
AST: 20 U/L (ref 0–37)
Albumin: 4.2 g/dL (ref 3.5–5.2)
Anion gap: 15 (ref 5–15)
BUN: 34 mg/dL — ABNORMAL HIGH (ref 6–23)
CO2: 28 meq/L (ref 19–32)
Calcium: 10.2 mg/dL (ref 8.4–10.5)
Chloride: 101 mEq/L (ref 96–112)
Creatinine, Ser: 2.14 mg/dL — ABNORMAL HIGH (ref 0.50–1.10)
GFR, EST AFRICAN AMERICAN: 22 mL/min — AB (ref >90)
GFR, EST NON AFRICAN AMERICAN: 19 mL/min — AB (ref >90)
GLUCOSE: 96 mg/dL (ref 70–99)
POTASSIUM: 4.4 meq/L (ref 3.7–5.3)
SODIUM: 144 meq/L (ref 137–147)
Total Bilirubin: 0.5 mg/dL (ref 0.3–1.2)
Total Protein: 7.4 g/dL (ref 6.0–8.3)

## 2014-05-17 LAB — I-STAT CG4 LACTIC ACID, ED: Lactic Acid, Venous: 1.09 mmol/L (ref 0.5–2.2)

## 2014-05-17 MED ORDER — ACETAMINOPHEN 325 MG PO TABS
650.0000 mg | ORAL_TABLET | Freq: Once | ORAL | Status: AC
  Administered 2014-05-17: 650 mg via ORAL
  Filled 2014-05-17: qty 2

## 2014-05-17 MED ORDER — CLINDAMYCIN HCL 300 MG PO CAPS: 300.0000 mg | ORAL_CAPSULE | Freq: Three times a day (TID) | ORAL | Status: DC

## 2014-05-17 MED ORDER — CLINDAMYCIN PHOSPHATE 600 MG/50ML IV SOLN
600.0000 mg | Freq: Once | INTRAVENOUS | Status: AC
  Administered 2014-05-17: 600 mg via INTRAVENOUS
  Filled 2014-05-17: qty 50

## 2014-05-17 NOTE — Progress Notes (Signed)
*  Preliminary Results* Right lower extremity venous duplex completed. Right lower extremity is negative for deep vein thrombosis. There is no evidence of right Baker's cyst.  05/17/2014 6:33 PM  Gertie FeyMichelle Revia Nghiem, RVT, RDCS, RDMS

## 2014-05-17 NOTE — ED Provider Notes (Signed)
CSN: [id number]     Arrival date & time 05/17/14  1512 History   First MD Initiated Contact with Patient 05/17/14 1516     Chief Complaint  Patient presents with  . Leg Injury     (Consider location/radiation/quality/duration/timing/severity/associated sxs/prior Treatment) The history is provided by the patient.  Valerie SessionLois B [name] is a 80 y.o. female hx of CHF, HTN, CKD here with R leg injury and swelling. She said that she may have hit her leg yesterday or 2 days ago. Since then, it has become more red and swollen. Denies bleeding and is not currently on blood thinners. Denies chest pain or shortness of breath. Denies hx of DVT or PE.    Past Medical History  Diagnosis Date  . CHF (congestive heart failure)   . Hypertension   . Cellulitis   . Arthritis   . Chronic kidney disease, stage III (moderate)   . Chronic anemia   . Severe tricuspid regurgitation    Past Surgical History  Procedure Laterality Date  . Knee surgery    . Elbow surgery     History reviewed. No pertinent family history. History  Substance Use Topics  . Smoking status: Never Smoker   . Smokeless tobacco: Not on file  . Alcohol Use: No   OB History   Grav Para Term Preterm Abortions TAB SAB Ect Mult Living                 Review of Systems  Musculoskeletal:       R leg pain   Skin: Positive for color change.  All other systems reviewed and are negative.     Allergies  Codeine phosphate and Morphine sulfate  Home Medications   Prior to Admission medications   Medication Sig Start Date End Date Taking? Authorizing Provider  acetaminophen (TYLENOL) 500 MG tablet Take 500 mg by mouth once as needed for mild pain or headache. As needed   Yes Historical Provider, MD  aspirin 81 MG tablet Take 81 mg by mouth daily.     Yes Historical Provider, MD  b complex vitamins tablet Take 2 tablets by mouth daily.   Yes Historical Provider, MD  calcium carbonate (OS-CAL) 600 MG TABS tablet Take 600 mg by  mouth daily with breakfast. With vitamin D   Yes Historical Provider, MD  Camphor-Eucalyptus-Menthol (VAPORX BALM EX) Apply 1 application topically daily. Applies to toe nails.   Yes Historical Provider, MD  ferrous sulfate 325 (65 FE) MG tablet Take 325 mg by mouth every evening.    Yes Historical Provider, MD  furosemide (LASIX) 40 MG tablet Take 40 mg by mouth daily.   Yes Historical Provider, MD  omeprazole (PRILOSEC) 20 MG capsule Take 20 mg by mouth daily.     Yes Historical Provider, MD  polyvinyl alcohol (LIQUIFILM TEARS) 1.4 % ophthalmic solution Place 1-2 drops into both eyes once as needed for dry eyes.   Yes Historical Provider, MD  rosuvastatin (CRESTOR) 5 MG tablet Take 5 mg by mouth every evening.     Yes Historical Provider, MD   BP 125/67  Pulse 70  Resp 16  SpO2 97% Physical Exam  Nursing note and vitals reviewed. Constitutional: She is oriented to person, place, and time.  Well appearing for age   HENT:  Head: Normocephalic.  Mouth/Throat: Oropharynx is clear and moist.  Eyes: Conjunctivae are normal. Pupils are equal, round, and reactive to light.  Neck: Normal range of motion. Neck supple.  Cardiovascular:  Normal rate, regular rhythm and normal heart sounds.   Pulmonary/Chest: Effort normal and breath sounds normal. No respiratory distress. She has no wheezes. She has no rales.  Abdominal: Soft. Bowel sounds are normal. She exhibits no distension. There is no tenderness. There is no rebound.  Musculoskeletal:  R calf area swollen, tender. Circumferential redness, no obvious abscess, 2+ pulses.    Neurological: She is alert and oriented to person, place, and time.  Skin: Skin is warm and dry. There is erythema.  Psychiatric: She has a normal mood and affect. Her behavior is normal. Judgment and thought content normal.    ED Course  Procedures (including critical care time) Labs Review Labs Reviewed  CBC WITH DIFFERENTIAL - Abnormal; Notable for the following:     RBC 3.48 (*)    Hemoglobin 10.9 (*)    HCT 33.1 (*)    All other components within normal limits  COMPREHENSIVE METABOLIC PANEL - Abnormal; Notable for the following:    BUN 34 (*)    Creatinine, Ser 2.14 (*)    GFR calc non Af Amer 19 (*)    GFR calc Af Amer 22 (*)    All other components within normal limits  I-STAT CG4 LACTIC ACID, ED    Imaging Review Dg Tibia/fibula Right  05/17/2014   CLINICAL DATA:  Right lower extremity pain and redness for 1 day  EXAM: RIGHT TIBIA AND FIBULA - 2 VIEW  COMPARISON:  October 22, 2010  FINDINGS: There is no evidence of fracture or dislocation. There are severe degenerative joint changes of left knee with narrowed joint space. There is diffuse osteopenia.  IMPRESSION: No acute fracture or dislocation.   Electronically Signed   By: Sherian ReinWei-Chen  Lin M.D.   On: 05/17/2014 15:54     EKG Interpretation None      MDM   Final diagnoses:  None    [name] is a 80 y.o. female here with R leg pain and redness after injury. Likely cellulitis vs DVT. Will get xrays, labs, duplex.   6:48 PM Xray showed no fracture. Cr at baseline. WBC nl. Lactate nl. DVT neg. Given clinda, will have her f/u with PMD.      Richardean Canalavid H Yao, MD 05/17/14 [phone]

## 2014-05-17 NOTE — Discharge Instructions (Signed)
Take clinda for a week.   Take tylenol for pain.   Follow up with your doctor.   Return to ER if you have severe pain, fever, worse redness and swelling.

## 2014-05-17 NOTE — ED Notes (Signed)
Pt states she hit her leg on something yesterday and now has contusion on her R shin. Alert and oriented. Lives at home w/ brother.

## 2014-05-17 NOTE — ED Notes (Signed)
Pt ambulated to the restroom with minimum assistance. ?

## 2014-05-17 NOTE — ED Notes (Signed)
Bed: [id number] Expected date: 05/17/14 Expected time: 3:00 PM Means of arrival: Ambulance Comments: Leg contusion 80 yo

## 2014-09-17 ENCOUNTER — Encounter: Payer: Self-pay | Admitting: Cardiology

## 2014-09-17 ENCOUNTER — Ambulatory Visit (INDEPENDENT_AMBULATORY_CARE_PROVIDER_SITE_OTHER): Payer: Medicare HMO | Admitting: Cardiology

## 2014-09-17 VITALS — BP 120/58 | HR 70 | Ht 64.0 in | Wt 118.0 lb

## 2014-09-17 DIAGNOSIS — I5032 Chronic diastolic (congestive) heart failure: Secondary | ICD-10-CM

## 2014-09-17 DIAGNOSIS — N184 Chronic kidney disease, stage 4 (severe): Secondary | ICD-10-CM

## 2014-09-17 DIAGNOSIS — I071 Rheumatic tricuspid insufficiency: Secondary | ICD-10-CM

## 2014-09-17 NOTE — Patient Instructions (Signed)
Please continue all medications as listed.  You should however take the Furosemide in the AM.  Follow up in 6 months with Dr. Anne FuSkains.  You will receive a letter in the mail 2 months before you are due.  Please call us when you receive this letter to schedule your follow up appointment.  Thank you for choosing [facility]!!

## 2014-09-17 NOTE — Progress Notes (Signed)
[address] [place], [place]  [address] Phone: [phone] Fax:  [phone]  Date:  09/17/2014   ID:  [name], DOB 01-20-1922, MRN [id number]  PCP:  Valerie Jenny, MD   History of Present Illness: [name] is a 81 y.o. female with a hx of CRI, CHF and severe TR. Patient denies chest pain, palpitations, dizziness, syncope, swelling, SOB, nor PND. She is accompanied by daughter. She is very concerned over what she eats, very regimented.  Prior hospitalizations reviewed. Overall she is maintaining her fluid well. No orthopnea. Wearing compression hose.  Cellulitis in 12/15. More confused. For some reason, they had her taking her Lasix in the evening hours. She is elevating her legs. Daughter is helpful with her care. Recent weight gain, in part fluid in lower extremities.   Wt Readings from Last 3 Encounters:  09/17/14 118 lb (53.524 kg)  03/18/14 107 lb (48.535 kg)  09/08/13 108 lb (48.988 kg)     Past Medical History  Diagnosis Date  . CHF (congestive heart failure)   . Hypertension   . Cellulitis   . Arthritis   . Chronic kidney disease, stage III (moderate)   . Chronic anemia   . Severe tricuspid regurgitation     Past Surgical History  Procedure Laterality Date  . Knee surgery    . Elbow surgery      Current Outpatient Prescriptions  Medication Sig Dispense Refill  . acetaminophen (TYLENOL) 500 MG tablet Take 500 mg by mouth once as needed for mild pain or headache. As needed    . aspirin 81 MG tablet Take 81 mg by mouth daily.      Marland Kitchen b complex vitamins tablet Take 2 tablets by mouth daily.    . calcium carbonate (OS-CAL) 600 MG TABS tablet Take 600 mg by mouth daily with breakfast. With vitamin D    . Camphor-Eucalyptus-Menthol (VAPORX BALM EX) Apply 1 application topically daily. Applies to toe nails.    . ferrous sulfate 325 (65 FE) MG tablet Take 325 mg by mouth every evening.     . furosemide (LASIX) 40 MG tablet Take 40 mg  by mouth daily. Taking one 40 mg tab in the AM and taking one 40 mg tab in the evening    . omeprazole (PRILOSEC) 20 MG capsule Take 20 mg by mouth daily.      . polyvinyl alcohol (LIQUIFILM TEARS) 1.4 % ophthalmic solution Place 1-2 drops into both eyes once as needed for dry eyes.    . rosuvastatin (CRESTOR) 5 MG tablet Take 5 mg by mouth every evening.       No current facility-administered medications for this visit.    Allergies:    Allergies  Allergen Reactions  . Codeine Phosphate Other (See Comments)    REACTION: confusion  . Morphine Sulfate Other (See Comments)    REACTION: confusion    Social History:  The patient  reports that she has never smoked. She does not have any smokeless tobacco history on file. She reports that she does not drink alcohol or use illicit drugs.   ROS:  Please see the history of present illness.   Denies any syncope, bleeding, orthopnea, PND   PHYSICAL EXAM: VS:  BP 120/58 mmHg  Pulse 70  Ht  (1.626 m)  Wt 118 lb (53.524 kg)  BMI 20.24 kg/m2 Thin, elderly, in no acute distressHEENT: normal Neck: no JVD Cardiac:  normal S1,  S2; RRR; Soft LLSB S murmur Lungs:  Mild crackles heard at bases.  Abd: soft, nontender, no hepatomegaly Ext: 1-2+ bilateral lower extremityWearing compression hose Skin: warm and dry Neuro: no focal abnormalities noted  EKG:  03/18/14-sinus rhythm, PVCs poor R wave progression, nonspecific ST-T wave changes, T wave inversion in inferior leads.  ASSESSMENT AND PLAN:  1. Chronic kidney disease stage IV-currently seeing nephrology. Monitoring. 2. Severe tricuspid regurgitation-likely in part responsible for her lower extremity edema. Agree with Lasix. I'm fine with her taking/trying 80 mg of Lasix in the a.m. Previously was taking her Lasix in the evening hour. 3. Tricuspid regurgitation. Currently well compensated 4. Mildly reduced ejection fraction-maintaining very well. No significant orthopnea or shortness of  breath.  5. Chronic diastolic heart failure-she has been maintaining balance very well. She has been concerned over the foods that she eats. With her being underweight, I'm fine with her eating food such as macaroni and cheese. We discussed this at length. She seems to be nervous about this. I want her to liberalize her fluid intake. Watch Salt. 6. 6 month f/u.  Signed, Valerie SchultzMark Skains, MD [facility]  09/17/2014 11:26 AM

## 2015-04-20 ENCOUNTER — Encounter: Payer: Self-pay | Admitting: Cardiology

## 2015-04-20 ENCOUNTER — Ambulatory Visit (INDEPENDENT_AMBULATORY_CARE_PROVIDER_SITE_OTHER): Payer: Medicare HMO | Admitting: Cardiology

## 2015-04-20 VITALS — BP 110/80 | HR 61 | Wt 109.2 lb

## 2015-04-20 DIAGNOSIS — I5032 Chronic diastolic (congestive) heart failure: Secondary | ICD-10-CM | POA: Insufficient documentation

## 2015-04-20 DIAGNOSIS — N184 Chronic kidney disease, stage 4 (severe): Secondary | ICD-10-CM

## 2015-04-20 DIAGNOSIS — I071 Rheumatic tricuspid insufficiency: Secondary | ICD-10-CM | POA: Diagnosis not present

## 2015-04-20 MED ORDER — FUROSEMIDE 20 MG PO TABS: 20.0000 mg | ORAL_TABLET | Freq: Every day | ORAL | Status: AC

## 2015-04-20 MED ORDER — ROSUVASTATIN CALCIUM 5 MG PO TABS: 5.0000 mg | ORAL_TABLET | Freq: Every evening | ORAL | Status: AC

## 2015-04-20 NOTE — Patient Instructions (Signed)
Medication Instructions:  Please decrease Furosemide to 20 mg daily. Continue all other medications as listed.  Follow-Up: Follow up in 6 months with Dr. Anne Fu.  You will receive a letter in the mail 2 months before you are due.  Please call us when you receive this letter to schedule your follow up appointment.  Thank you for choosing [facility]!!

## 2015-04-20 NOTE — Addendum Note (Signed)
Addended by: Sharin Grave on: 04/20/2015 02:09 PM   Modules accepted: Orders

## 2015-04-20 NOTE — Progress Notes (Signed)
[address] [place], [place]  [address] Phone: [phone] Fax:  [phone]  Date:  04/20/2015   ID:  [name], DOB November 08, 1921, MRN [id number]  PCP:  Valerie Jenny, MD   History of Present Illness: [name] is a 81 y.o. female with a hx of CRI, CHF and severe TR. Patient denies chest pain, palpitations, dizziness, syncope, swelling, SOB, nor PND. She is accompanied by daughter. She is very concerned over what she eats, very regimented.  Prior hospitalizations reviewed. Overall she is maintaining her fluid well. No orthopnea. Wearing compression hose.  Cellulitis in 12/15. More confused. For some reason, they had her taking her Lasix in the evening hours. She is elevating her legs. Daughter is helpful with her care. Recent weight gain, in part fluid in lower extremities.  04/20/15-overall doing well. Once again discussed foods. She is a K to eat cheese. She has decreased her Lasix to 40 mg once a day and I will decrease this further to 20. Wearing compression hose. Head cold for the past 6 months, question allergies. She was hesitant to use any new medications. Possible [place] HBP ok.  Wt Readings from Last 3 Encounters:  04/20/15 109 lb 4 oz (49.555 kg)  09/17/14 118 lb (53.524 kg)  03/18/14 107 lb (48.535 kg)     Past Medical History  Diagnosis Date  . CHF (congestive heart failure)   . Hypertension   . Cellulitis   . Arthritis   . Chronic kidney disease, stage III (moderate)   . Chronic anemia   . Severe tricuspid regurgitation     Past Surgical History  Procedure Laterality Date  . Knee surgery    . Elbow surgery      Current Outpatient Prescriptions  Medication Sig Dispense Refill  . acetaminophen (TYLENOL) 500 MG tablet Take 500 mg by mouth once as needed for mild pain or headache. As needed    . aspirin 81 MG tablet Take 81 mg by mouth daily.      Marland Kitchen b complex vitamins tablet Take 2 tablets by mouth daily.    . calcium  carbonate (OS-CAL) 600 MG TABS tablet Take 600 mg by mouth daily with breakfast. With vitamin D    . Camphor-Eucalyptus-Menthol (VAPORX BALM EX) Apply 1 application topically daily. Applies to toe nails.    . ferrous sulfate 325 (65 FE) MG tablet Take 325 mg by mouth every evening.     . furosemide (LASIX) 40 MG tablet Take 40 mg by mouth daily. Taking one 40 mg tab in the AM and taking one 40 mg tab in the evening    . omeprazole (PRILOSEC) 20 MG capsule Take 20 mg by mouth daily.      . polyvinyl alcohol (LIQUIFILM TEARS) 1.4 % ophthalmic solution Place 1-2 drops into both eyes once as needed for dry eyes.    . rosuvastatin (CRESTOR) 5 MG tablet Take 5 mg by mouth every evening.       No current facility-administered medications for this visit.    Allergies:    Allergies  Allergen Reactions  . Codeine Phosphate Other (See Comments)    REACTION: confusion  . Morphine Sulfate Other (See Comments)    REACTION: confusion    Social History:  The patient  reports that she has never smoked. She does not have any smokeless tobacco history on file. She reports that she does not drink alcohol or use illicit drugs.  ROS:  Please see the history of present illness.   Denies any syncope, bleeding, orthopnea, PND   PHYSICAL EXAM: VS:  BP 110/80 mmHg  Pulse 61  Wt 109 lb 4 oz (49.555 kg) Thin, elderly, in no acute distressHEENT: normal Neck: no JVD Cardiac:  normal S1, S2; RRR; Soft LLSB S murmur Lungs:  Mild crackles heard at bases.  Abd: soft, nontender, no hepatomegaly Ext: no lower extremityWearing compression hose Skin: warm and dry Neuro: no focal abnormalities noted  EKG:  Today, 04/20/15-sinus rhythm, PVC noted, sinus arrhythmia noted as well, left axis deviation, incomplete right bundle branch block, poor R-wave progression cannot rule out anterior wall infarct, T-wave inversion inferior leads, consider ischemia.-(To prior EKG, no significant change personally viewed-03/18/14-sinus  rhythm, PVCs poor R wave progression, nonspecific ST-T wave changes, T wave inversion in inferior leads.  Labs: LDL 49, creatinine 2.1  ASSESSMENT AND PLAN:  1. Chronic kidney disease stage IV-currently seeing nephrology. Monitoring. 2. Severe tricuspid regurgitation-likely in part responsible for her lower extremity edema. Agree with Lasix, we will decrease to 20 mg once a day. She has most recently been taking 40 mg once a day. Her fluid status is excellent. Compression hose, wonderful.  3. Tricuspid regurgitation. Currently well compensated 4. Mildly reduced ejection fraction-maintaining very well. No significant orthopnea or shortness of breath.  5. Chronic diastolic heart failure-she has been maintaining balance very well. She has been concerned over the foods that she eats. With her being underweight, I'm fine with her eating food such as macaroni and cheese. We discussed this at length. She seems to be nervous about this. Watch Salt. 6. 6 month f/u.  Signed, Donato Schultz, MD [facility]  04/20/2015 10:33 AM

## 2015-05-25 ENCOUNTER — Encounter: Payer: Self-pay | Admitting: Gastroenterology

## 2015-07-17 IMAGING — CR DG TIBIA/FIBULA 2V*R*
2 series · 2 of 2 positions shown · non-contrast
Comparison: October 22, 2010

CLINICAL DATA: Right lower extremity pain and redness for 1 day

EXAM:
RIGHT TIBIA AND FIBULA - 2 VIEW

[x tib-fib ap right]
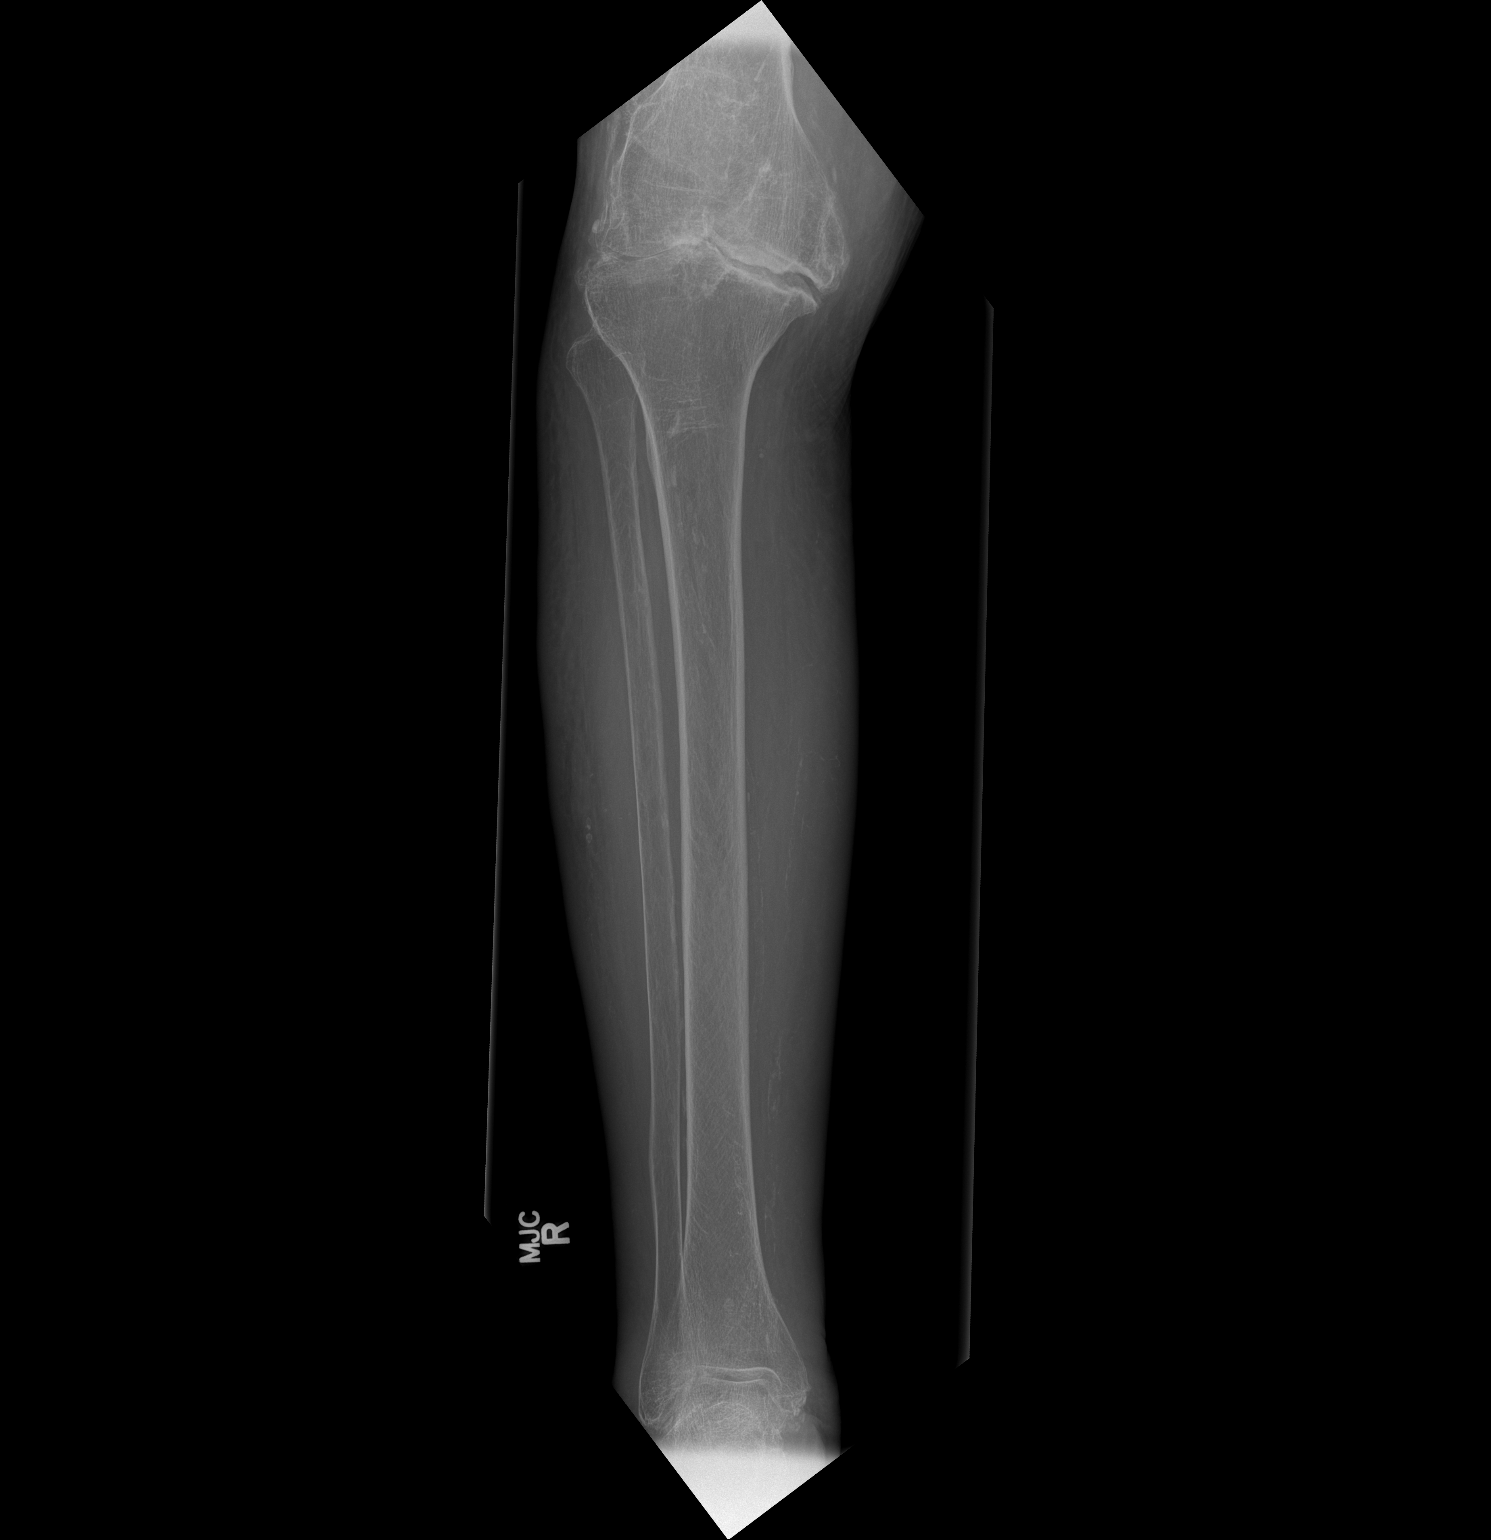

[x tib-fib lat right]
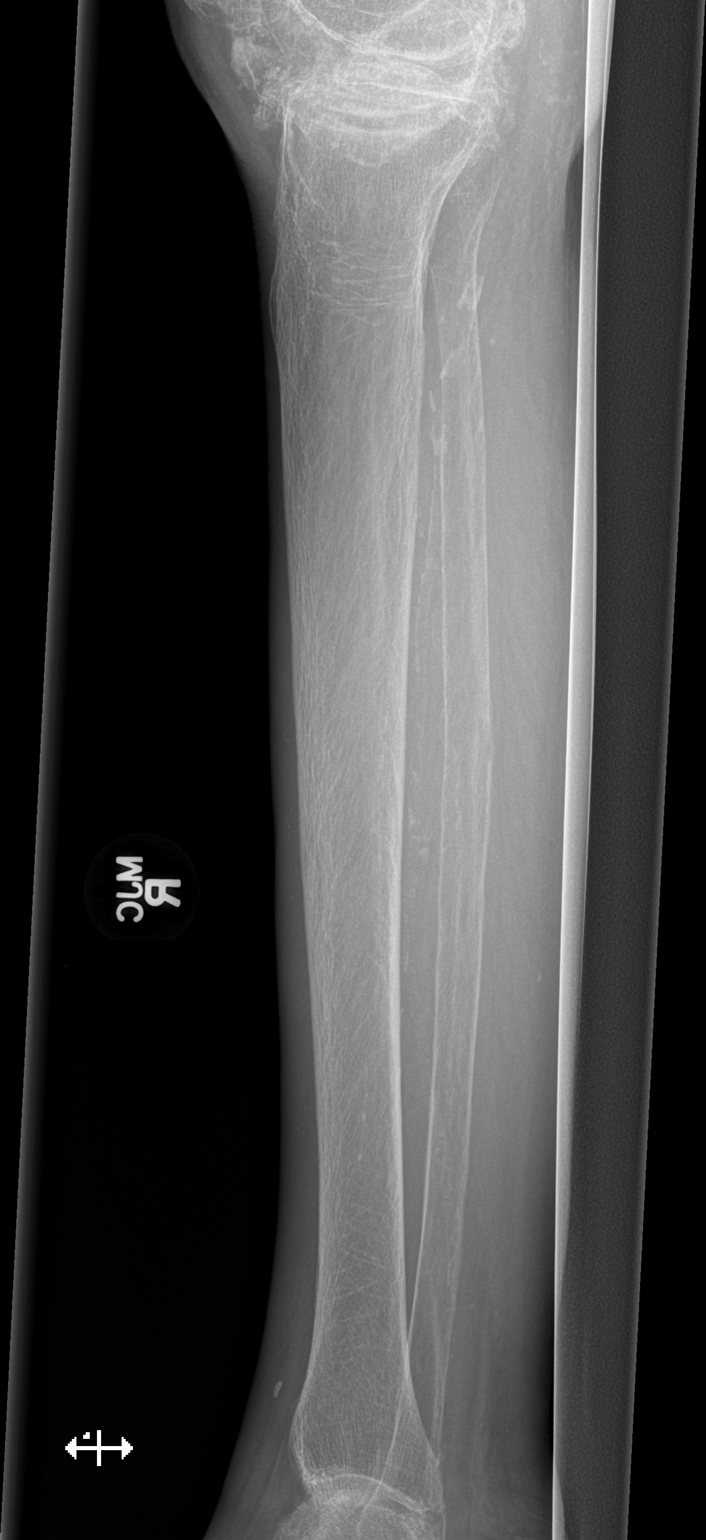

[2 of 2 positions shown; findings below may reference images not displayed]

FINDINGS: There is no evidence of fracture or dislocation. There are severe
degenerative joint changes of left knee with narrowed joint space.
There is diffuse osteopenia.
IMPRESSION: No acute fracture or dislocation.

## 2015-10-04 ENCOUNTER — Other Ambulatory Visit: Payer: Self-pay | Admitting: Cardiology

## 2015-10-04 NOTE — Telephone Encounter (Signed)
I do not see where Dr Anne Fu has ever refilled this medication for the patient. Please advise. Thanks, MI

## 2015-10-04 NOTE — Telephone Encounter (Signed)
°*  STAT* If patient is at the pharmacy, call can be transferred to refill team.   1. Which medications need to be refilled? (please list name of each medication and dose if known) Omeprazole-new prescription  2. Which pharmacy/location (including street and city if local pharmacy) is medication to be sent to?[phone] 3. Do they need a 30 day or 90 day supply? 90 and refills

## 2015-10-06 NOTE — Telephone Encounter (Signed)
PCP has typically filled her Omeprazole 20 mg.  She is now only seeing Dr. Anne Fu.  She has enough to last her through to her appointment next week.  Will discuss with Dr. Anne Fu at the appointment next week

## 2015-10-11 ENCOUNTER — Telehealth: Payer: Self-pay | Admitting: Physician Assistant

## 2015-10-20 ENCOUNTER — Ambulatory Visit: Payer: Medicare HMO | Admitting: Cardiology

## 2015-11-06 NOTE — Telephone Encounter (Signed)
I was called by EMS because Ms [name] was found unresponsive at home sitting on her bed.  It was ~30 mins before EMS arrived.  Rhythm was asystole.  I agreed to sign the death certificate.  Valerie Cones, PAC

## 2015-11-06 DEATH — deceased
